# Patient Record
Sex: Female | Born: 1975 | Race: Black or African American | Hispanic: No | Marital: Single | Smoking: Never smoker
Health system: Southern US, Community
[De-identification: ages and names within clinical notes are randomized; demographics above are authoritative.]

## PROBLEM LIST (undated history)

## (undated) DIAGNOSIS — D573 Sickle-cell trait: Secondary | ICD-10-CM

## (undated) DIAGNOSIS — I10 Essential (primary) hypertension: Secondary | ICD-10-CM

## (undated) HISTORY — DX: Sickle-cell trait: D57.3

---

## 2005-06-26 ENCOUNTER — Inpatient Hospital Stay: Payer: Self-pay | Admitting: Obstetrics & Gynecology

## 2014-04-07 DIAGNOSIS — D573 Sickle-cell trait: Secondary | ICD-10-CM | POA: Insufficient documentation

## 2015-01-30 DIAGNOSIS — E669 Obesity, unspecified: Secondary | ICD-10-CM | POA: Insufficient documentation

## 2015-04-12 ENCOUNTER — Emergency Department: Payer: Self-pay

## 2015-04-12 ENCOUNTER — Emergency Department
Admission: EM | Admit: 2015-04-12 | Discharge: 2015-04-12 | Disposition: A | Discharge status: Home / Self Care | Payer: Self-pay | Attending: Emergency Medicine | Admitting: Emergency Medicine

## 2015-04-12 DIAGNOSIS — I10 Essential (primary) hypertension: Secondary | ICD-10-CM | POA: Insufficient documentation

## 2015-04-12 DIAGNOSIS — R42 Dizziness and giddiness: Secondary | ICD-10-CM | POA: Insufficient documentation

## 2015-04-12 LAB — CBC WITH DIFFERENTIAL/PLATELET
BASOS ABS: 0 10*3/uL (ref 0–0.1)
BASOS PCT: 1 %
EOS PCT: 2 %
Eosinophils Absolute: 0.1 10*3/uL (ref 0–0.7)
HCT: 41.7 % (ref 35.0–47.0)
Hemoglobin: 13.8 g/dL (ref 12.0–16.0)
Lymphocytes Relative: 35 %
Lymphs Abs: 1.9 10*3/uL (ref 1.0–3.6)
MCH: 25.9 pg — ABNORMAL LOW (ref 26.0–34.0)
MCHC: 33.2 g/dL (ref 32.0–36.0)
MCV: 78.1 fL — ABNORMAL LOW (ref 80.0–100.0)
MONO ABS: 0.4 10*3/uL (ref 0.2–0.9)
MONOS PCT: 8 %
Neutro Abs: 3 10*3/uL (ref 1.4–6.5)
Neutrophils Relative %: 54 %
PLATELETS: 262 10*3/uL (ref 150–440)
RBC: 5.34 MIL/uL — ABNORMAL HIGH (ref 3.80–5.20)
RDW: 14.1 % (ref 11.5–14.5)
WBC: 5.5 10*3/uL (ref 3.6–11.0)

## 2015-04-12 LAB — COMPREHENSIVE METABOLIC PANEL
ALBUMIN: 4 g/dL (ref 3.5–5.0)
ALT: 13 U/L — ABNORMAL LOW (ref 14–54)
ANION GAP: 9 (ref 5–15)
AST: 20 U/L (ref 15–41)
Alkaline Phosphatase: 76 U/L (ref 38–126)
BUN: 11 mg/dL (ref 6–20)
CHLORIDE: 104 mmol/L (ref 101–111)
CO2: 24 mmol/L (ref 22–32)
Calcium: 9.2 mg/dL (ref 8.9–10.3)
Creatinine, Ser: 0.83 mg/dL (ref 0.44–1.00)
GFR calc Af Amer: >60 mL/min (ref >60)
GLUCOSE: 132 mg/dL — AB (ref 65–99)
POTASSIUM: 3.1 mmol/L — AB (ref 3.5–5.1)
Sodium: 137 mmol/L (ref 135–145)
TOTAL PROTEIN: 7.3 g/dL (ref 6.5–8.1)
Total Bilirubin: 0.7 mg/dL (ref 0.3–1.2)

## 2015-04-12 LAB — TROPONIN I

## 2015-04-12 MED ORDER — GADOBENATE DIMEGLUMINE 529 MG/ML IV SOLN
15.0000 mL | Freq: Once | INTRAVENOUS | Status: AC | PRN
  Administered 2015-04-12: 14 mL via INTRAVENOUS

## 2015-04-12 MED ORDER — SODIUM CHLORIDE 0.9 % IV BOLUS (SEPSIS)
1000.0000 mL | Freq: Once | INTRAVENOUS | Status: AC
  Administered 2015-04-12: 1000 mL via INTRAVENOUS

## 2015-04-12 MED ORDER — MECLIZINE HCL 25 MG PO TABS: 25.0000 mg | ORAL_TABLET | Freq: Three times a day (TID) | ORAL | Status: AC | PRN

## 2015-04-12 MED ORDER — METOCLOPRAMIDE HCL 5 MG/ML IJ SOLN
10.0000 mg | Freq: Once | INTRAMUSCULAR | Status: AC
  Administered 2015-04-12: 10 mg via INTRAVENOUS
  Filled 2015-04-12: qty 2

## 2015-04-12 MED ORDER — DIAZEPAM 5 MG/ML IJ SOLN
5.0000 mg | Freq: Once | INTRAMUSCULAR | Status: AC
  Administered 2015-04-12: 5 mg via INTRAVENOUS
  Filled 2015-04-12: qty 2

## 2015-04-12 MED ORDER — MECLIZINE HCL 25 MG PO TABS
25.0000 mg | ORAL_TABLET | Freq: Once | ORAL | Status: AC
  Administered 2015-04-12: 25 mg via ORAL
  Filled 2015-04-12: qty 1

## 2015-04-12 NOTE — ED Notes (Signed)
Spoke with dr. Huel Cote regarding pt's chief complaint and triage assessment. Order for blood work received, discussed with md need for possible ct scan ofhead, no ct order received.

## 2015-04-12 NOTE — ED Notes (Signed)
Pt states awoke at 0600 to go to restroom and became suddenly dizzy. Pt states 'i feel funny, my head, i just feel spinning". Pt states is more dizzy when she looks to the left. Pt denies pain, had one episode of emesis.

## 2015-04-12 NOTE — ED Notes (Signed)
Patient transported to MRI 

## 2015-04-12 NOTE — ED Notes (Signed)
Report to derrick, rn. Pt to room 16.

## 2015-04-12 NOTE — ED Notes (Signed)
Patient transported to CT 

## 2015-04-12 NOTE — ED Provider Notes (Addendum)
[facility] Emergency Department Provider Note  ____________________________________________  Time seen: Approximately 710 AM  I have reviewed the triage vital signs and the nursing notes.   HISTORY  Chief Complaint Dizziness    HPI Sara Schneider is a 39 y.o. female with a history of hypertension who is presenting with vertigo. The patient awoke at 6 AM and said that the whole room was spinning. She says that the symptoms have improved at this time and there is no vertigo unless she moves her head to the left. At that point the room does begin to spin. Denies any pain. Does not hear any ringing or roaring in her ears. York Spaniel that she has had recently a mild upper respiratory infection with a small amount of nasal congestion.  Denies any pressure in her ears. Never had an event like this in the past.He said that her mother had a stroke when she was in her 30s.   No past medical history on file. hypertension   There are no active problems to display for this patient.   No past surgical history on file.  No current outpatient prescriptions on file.  Allergies Review of patient's allergies indicates no known allergies.  No family history on file.  Social History Social History  Substance Use Topics  . Smoking status: Not on file  . Smokeless tobacco: Not on file  . Alcohol Use: Not on file    Review of Systems Constitutional: No fever/chills Eyes: No visual changes. ENT: No sore throat. Cardiovascular: Denies chest pain. Respiratory: Denies shortness of breath. Gastrointestinal: No abdominal pain.  No nausea, no vomiting.  No diarrhea.  No constipation. Genitourinary: Negative for dysuria. Musculoskeletal: Negative for back pain. Skin: Negative for rash. Neurological: Negative for headaches, focal weakness or numbness.  10-point ROS otherwise negative.  ____________________________________________   PHYSICAL EXAM:  VITAL SIGNS: ED Triage  Vitals  Enc Vitals Group     BP 04/12/15 0650 124/76 mmHg     Pulse Rate 04/12/15 0650 80     Resp 04/12/15 0650 16     Temp 04/12/15 0650 98 F (36.7 C)     Temp Source 04/12/15 0650 Oral     SpO2 04/12/15 0650 100 %     Weight 04/12/15 0650 150 lb (68.04 kg)     Height 04/12/15 0650  (1.575 m)     Head Cir --      Peak Flow --      Pain Score --      Pain Loc --      Pain Edu? --      Excl. in GC? --     Constitutional: Alert and oriented. Well appearing and in no acute distress. Eyes: Conjunctivae are normal. PERRL. EOMI.no nystagmus  Head: Atraumatic. right TM with mild amount of fluid behind it. Neither TM with any bulging or erythema.  Nose: No congestion/rhinnorhea. Mouth/Throat: Mucous membranes are moist.  Oropharynx non-erythematous. Neck: No stridor.   Cardiovascular: Normal rate, regular rhythm. Grossly normal heart sounds.  Good peripheral circulation. Respiratory: Normal respiratory effort.  No retractions. Lungs CTAB. Gastrointestinal: Soft and nontender. No distention. No abdominal bruits. No CVA tenderness. Musculoskeletal: No lower extremity tenderness nor edema.  No joint effusions. Neurologic:  Normal speech and language. No gross focal neurologic deficits are appreciano ataxia on heel to shin movements bilaterally. No ataxia on finger to nose testing bilaterally.  When patient turns her head to the left she experiences a severe vertiginous sensation.  Skin:  Skin is warm, dry and intact. No rash noted. Psychiatric: Mood and affect are normal. Speech and behavior are normal.  ____________________________________________   LABS (all labs ordered are listed, but only abnormal results are displayed)  Labs Reviewed  CBC WITH DIFFERENTIAL/PLATELET - Abnormal; Notable for the following:    RBC 5.34 (*)    MCV 78.1 (*)    MCH 25.9 (*)    All other components within normal limits  TROPONIN I  COMPREHENSIVE METABOLIC PANEL    ____________________________________________  EKG  ED ECG REPORT I, Arelia Longest, the attending physician, personally viewed and interpreted this ECG.   Date: 04/12/2015  EKG Time: 702  Rate: 75  Rhythm: normal sinus rhythm  Axis: Normal axis  Intervals:Mildly prolonged QT interval  ST&T Change: No ST segment elevations or depressions. No abnormal T-wave inversion.  ____________________________________________  RADIOLOGY  Normal CAT scan as well as MRI. ____________________________________________   PROCEDURES   ____________________________________________   INITIAL IMPRESSION / ASSESSENT AND PLAN / ED COURSE  Pertinent labs & imaging results that were available during my care of the patient were reviewed by me and considered in my medical decision making (see chart for details).  Unlikely to be CVA. Patient's symptoms are exquisitely reproduced with motion. Furthermore, the symptoms have improved rapidly which would not make this patient a TPA candidate. We will do a trial of meclizine.  ----------------------------------------- 8:50 AM on 04/12/2015 -----------------------------------------  Patient reassessed and now able to ambulate on her own with a normal gait. However, did vomit in the bathroom when she went to the bathroom prior to my arrival. Says is still feeling dizzy but improved. Attempted a Epley maneuver which induced nystagmus with the patient having very difficult time tolerating and vomited. We will move to IV medications. Will give Reglan, Valium and fluids.  ----------------------------------------- 2:24 PM on 04/12/2015 -----------------------------------------  Patient still with positional vertigo but with multiple normal studies and feeling normal when still. Was able to elicit nystagmus previously on Apley maneuver. We'll be able to discharged home. Will give follow-up with her nose and throat doctor the patient says that she is  feeling better with medication however is not currently back to her baseline. Because she did not return completely to her baseline is the reason why proceeded with advanced radiologic imaging. However, given the multiple negative studies at for confident now that this presentation is most likely peripheral vertigo and can be discharged home. The patient was advised to rest for the next several days and do the Epley maneuver which was given to her on a paper with instructions. She understands the plan and is willing to comply. ____________________________________________   FINAL CLINICAL IMPRESSION(S) / ED DIAGNOSES  Acute vertigo. Initial visit.    Myrna Blazer, MD 04/12/15 1429  Myrna Blazer, MD 04/12/15 [phone]

## 2015-04-12 NOTE — Discharge Instructions (Signed)
Benign Positional Vertigo Vertigo means you feel like you or your surroundings are moving when they are not. Benign positional vertigo is the most common form of vertigo. Benign means that the cause of your condition is not serious. Benign positional vertigo is more common in older adults. CAUSES  Benign positional vertigo is the result of an upset in the labyrinth system. This is an area in the middle ear that helps control your balance. This may be caused by a viral infection, head injury, or repetitive motion. However, often no specific cause is found. SYMPTOMS  Symptoms of benign positional vertigo occur when you move your head or eyes in different directions. Some of the symptoms may include: 1. Loss of balance and falls. 2. Vomiting. 3. Blurred vision. 4. Dizziness. 5. Nausea. 6. Involuntary eye movements (nystagmus). DIAGNOSIS  Benign positional vertigo is usually diagnosed by physical exam. If the specific cause of your benign positional vertigo is unknown, your caregiver may perform imaging tests, such as magnetic resonance imaging (MRI) or computed tomography (CT). TREATMENT  Your caregiver may recommend movements or procedures to correct the benign positional vertigo. Medicines such as meclizine, benzodiazepines, and medicines for nausea may be used to treat your symptoms. In rare cases, if your symptoms are caused by certain conditions that affect the inner ear, you may need surgery. HOME CARE INSTRUCTIONS   Follow your caregiver's instructions.  Move slowly. Do not make sudden body or head movements.  Avoid driving.  Avoid operating heavy machinery.  Avoid performing any tasks that would be dangerous to you or others during a vertigo episode.  Drink enough fluids to keep your urine clear or pale yellow. SEEK IMMEDIATE MEDICAL CARE IF:   You develop problems with walking, weakness, numbness, or using your arms, hands, or legs.  You have difficulty speaking.  You develop  severe headaches.  Your nausea or vomiting continues or gets worse.  You develop visual changes.  Your family or friends notice any behavioral changes.  Your condition gets worse.  You have a fever.  You develop a stiff neck or sensitivity to light. MAKE SURE YOU:   Understand these instructions.  Will watch your condition.  Will get help right away if you are not doing well or get worse. Document Released: 05/03/2006 Document Revised: 10/18/2011 Document Reviewed: 04/15/2011 [facility] Patient Information 2015 [place], [place]. This information is not intended to replace advice given to you by your health care provider. Make sure you discuss any questions you have with your health care provider.  Dizziness  Dizziness means you feel unsteady or lightheaded. You might feel like you are going to pass out (faint). HOME CARE  7. Drink enough fluids to keep your pee (urine) clear or pale yellow. 8. Take your medicines exactly as told by your doctor. If you take blood pressure medicine, always stand up slowly from the lying or sitting position. Hold on to something to steady yourself. 9. If you need to stand in one place for a long time, move your legs often. Tighten and relax your leg muscles. 10. Have someone stay with you until you feel okay. 11. Do not drive or use heavy machinery if you feel dizzy. 12. Do not drink alcohol. GET HELP RIGHT AWAY IF:   You feel dizzy or lightheaded and it gets worse.  You feel sick to your stomach (nauseous), or you throw up (vomit).  You have trouble talking or walking.  You feel weak or have trouble using your arms, hands,  or legs.  You cannot think clearly or have trouble forming sentences.  You have chest pain, belly (abdominal) pain, sweating, or you are short of breath.  Your vision changes.  You are bleeding.  You have problems from your medicine that seem to be getting worse. MAKE SURE YOU:   Understand these  instructions.  Will watch your condition.  Will get help right away if you are not doing well or get worse. Document Released: 07/15/2011 Document Revised: 10/18/2011 Document Reviewed: 07/15/2011 [facility] Patient Information 2015 [place], [place]. This information is not intended to replace advice given to you by your health care provider. Make sure you discuss any questions you have with your health care provider.  Epley Maneuver Self-Care WHAT IS THE EPLEY MANEUVER? The Epley maneuver is an exercise you can do to relieve symptoms of benign paroxysmal positional vertigo (BPPV). This condition is often just referred to as vertigo. BPPV is caused by the movement of tiny crystals (canaliths) inside your inner ear. The accumulation and movement of canaliths in your inner ear causes a sudden spinning sensation (vertigo) when you move your head to certain positions. Vertigo usually lasts about 30 seconds. BPPV usually occurs in just one ear. If you get vertigo when you lie on your left side, you probably have BPPV in your left ear. Your health care provider can tell you which ear is involved.  BPPV may be caused by a head injury. Many people older than 50 get BPPV for unknown reasons. If you have been diagnosed with BPPV, your health care provider may teach you how to do this maneuver. BPPV is not life threatening (benign) and usually goes away in time.  WHEN SHOULD I PERFORM THE EPLEY MANEUVER? You can do this maneuver at home whenever you have symptoms of vertigo. You may do the Epley maneuver up to 3 times a day until your symptoms of vertigo go away. HOW SHOULD I DO THE EPLEY MANEUVER? 13. Sit on the edge of a bed or table with your back straight. Your legs should be extended or hanging over the edge of the bed or table.  14. Turn your head halfway toward the affected ear.  15. Lie backward quickly with your head turned until you are lying flat on your back. You may want to position a pillow under  your shoulders.  16. Hold this position for 30 seconds. You may experience an attack of vertigo. This is normal. Hold this position until the vertigo stops. 17. Then turn your head to the opposite direction until your unaffected ear is facing the floor.  18. Hold this position for 30 seconds. You may experience an attack of vertigo. This is normal. Hold this position until the vertigo stops. 19. Now turn your whole body to the same side as your head. Hold for another 30 seconds.  20. You can then sit back up. ARE THERE RISKS TO THIS MANEUVER? In some cases, you may have other symptoms (such as changes in your vision, weakness, or numbness). If you have these symptoms, stop doing the maneuver and call your health care provider. Even if doing these maneuvers relieves your vertigo, you may still have dizziness. Dizziness is the sensation of light-headedness but without the sensation of movement. Even though the Epley maneuver may relieve your vertigo, it is possible that your symptoms will return within 5 years. WHAT SHOULD I DO AFTER THIS MANEUVER? After doing the Epley maneuver, you can return to your normal activities. Ask your doctor  if there is anything you should do at home to prevent vertigo. This may include:  Sleeping with two or more pillows to keep your head elevated.  Not sleeping on the side of your affected ear.  Getting up slowly from bed.  Avoiding sudden movements during the day.  Avoiding extreme head movement, like looking up or bending over.  Wearing a cervical collar to prevent sudden head movements. WHAT SHOULD I DO IF MY SYMPTOMS GET WORSE? Call your health care provider if your vertigo gets worse. Call your provider right way if you have other symptoms, including:   Nausea.  Vomiting.  Headache.  Weakness.  Numbness.  Vision changes. Document Released: 07/31/2013 Document Reviewed: 07/31/2013 [facility] Patient Information 2015 [place], [place]. This  information is not intended to replace advice given to you by your health care provider. Make sure you discuss any questions you have with your health care provider.

## 2015-04-12 NOTE — ED Notes (Signed)
pT RETURNED FROM ct

## 2015-04-22 DIAGNOSIS — Z8742 Personal history of other diseases of the female genital tract: Secondary | ICD-10-CM | POA: Insufficient documentation

## 2016-10-18 ENCOUNTER — Emergency Department
Admission: EM | Admit: 2016-10-18 | Discharge: 2016-10-18 | Disposition: A | Discharge status: Home / Self Care | Payer: BLUE CROSS/BLUE SHIELD | Attending: Emergency Medicine | Admitting: Emergency Medicine

## 2016-10-18 ENCOUNTER — Encounter: Payer: Self-pay | Admitting: Emergency Medicine

## 2016-10-18 DIAGNOSIS — Z79899 Other long term (current) drug therapy: Secondary | ICD-10-CM | POA: Diagnosis not present

## 2016-10-18 DIAGNOSIS — I10 Essential (primary) hypertension: Secondary | ICD-10-CM | POA: Insufficient documentation

## 2016-10-18 DIAGNOSIS — R42 Dizziness and giddiness: Secondary | ICD-10-CM | POA: Diagnosis not present

## 2016-10-18 HISTORY — DX: Essential (primary) hypertension: I10

## 2016-10-18 LAB — BASIC METABOLIC PANEL
ANION GAP: 8 (ref 5–15)
BUN: 9 mg/dL (ref 6–20)
CHLORIDE: 102 mmol/L (ref 101–111)
CO2: 26 mmol/L (ref 22–32)
Calcium: 9.3 mg/dL (ref 8.9–10.3)
Creatinine, Ser: 0.58 mg/dL (ref 0.44–1.00)
GFR calc Af Amer: >60 mL/min (ref >60)
GFR calc non Af Amer: >60 mL/min (ref >60)
Glucose, Bld: 100 mg/dL — ABNORMAL HIGH (ref 65–99)
Potassium: 3.7 mmol/L (ref 3.5–5.1)
Sodium: 136 mmol/L (ref 135–145)

## 2016-10-18 LAB — CBC
HCT: 42.5 % (ref 35.0–47.0)
Hemoglobin: 14.2 g/dL (ref 12.0–16.0)
MCH: 25.9 pg — AB (ref 26.0–34.0)
MCHC: 33.5 g/dL (ref 32.0–36.0)
MCV: 77.1 fL — AB (ref 80.0–100.0)
Platelets: 291 10*3/uL (ref 150–440)
RBC: 5.51 MIL/uL — ABNORMAL HIGH (ref 3.80–5.20)
RDW: 13.7 % (ref 11.5–14.5)
WBC: 6.2 10*3/uL (ref 3.6–11.0)

## 2016-10-18 LAB — URINALYSIS, COMPLETE (UACMP) WITH MICROSCOPIC
BACTERIA UA: NONE SEEN
Bilirubin Urine: NEGATIVE
Glucose, UA: NEGATIVE mg/dL
Hgb urine dipstick: NEGATIVE
Ketones, ur: NEGATIVE mg/dL
Leukocytes, UA: NEGATIVE
Nitrite: NEGATIVE
PROTEIN: NEGATIVE mg/dL
SQUAMOUS EPITHELIAL / LPF: NONE SEEN
Specific Gravity, Urine: 1.005 (ref 1.005–1.030)
pH: 8 (ref 5.0–8.0)

## 2016-10-18 MED ORDER — PSEUDOEPHEDRINE HCL ER 120 MG PO TB12
120.0000 mg | ORAL_TABLET | Freq: Two times a day (BID) | ORAL | Status: DC
  Administered 2016-10-18: 120 mg via ORAL
  Filled 2016-10-18 (×2): qty 1

## 2016-10-18 MED ORDER — MECLIZINE HCL 32 MG PO TABS: 32.0000 mg | ORAL_TABLET | Freq: Three times a day (TID) | ORAL | 0 refills | Status: AC | PRN

## 2016-10-18 MED ORDER — PSEUDOEPHEDRINE HCL ER 120 MG PO TB12: 120.0000 mg | ORAL_TABLET | Freq: Two times a day (BID) | ORAL | 2 refills | Status: AC | PRN

## 2016-10-18 MED ORDER — MECLIZINE HCL 25 MG PO TABS
50.0000 mg | ORAL_TABLET | Freq: Once | ORAL | Status: AC
Start: 2016-10-18 — End: 2016-10-18
  Administered 2016-10-18: 50 mg via ORAL
  Filled 2016-10-18: qty 2

## 2016-10-18 NOTE — ED Notes (Signed)
First nurse note  Presents with dizziness  States she was dx'd with vertigo about 1 week ago  Sx's are worse

## 2016-10-18 NOTE — ED Triage Notes (Signed)
Pt with vertigo that started Feb 28. Went to urgent care 3/5 and was given meclizine x 2 days and OTC allergy meds. The meclizine helped some but pt states she is still very dizzy and unsteady on her feet.

## 2016-10-18 NOTE — ED Provider Notes (Signed)
[facility] Emergency Department Provider Note   ____________________________________________   First MD Initiated Contact with Patient 10/18/16 1313     (approximate)  I have reviewed the triage vital signs and the nursing notes.   HISTORY  Chief Complaint Dizziness    HPI Sara Schneider is a 41 y.o. female  patient complaining of vertigo for 2 weeks. Patient stated onset was the 28th for a 2018. Patient states she went to urgent care on 10/09/2016 and was given Antivert and Sudafed. Stated medication helped but she is out of the medication and is now having vertigo again. Patient denies nausea or vision disturbance. Patient state has similar episode one half years ago which last approximately 3 weeks. Patient stated at that time she had CT scan and MRI which were negative. Patient was referred to Vestibular rehabilitation on 10/13/2016. Patient's appointment is 10/22/2016. Past Medical History:  Diagnosis Date  . Hypertension     There are no active problems to display for this patient.   No past surgical history on file.  Prior to Admission medications   Medication Sig Start Date End Date Taking? Authorizing Provider  lisinopril-hydrochlorothiazide (PRINZIDE,ZESTORETIC) 20-25 MG per tablet Take 1 tablet by mouth daily.    Historical Provider, MD  meclizine (ANTIVERT) 25 MG tablet Take 1 tablet (25 mg total) by mouth 3 (three) times daily as needed for dizziness. 04/12/15   Myrna Blazer, MD  meclizine (ANTIVERT) 32 MG tablet Take 1 tablet (32 mg total) by mouth 3 (three) times daily as needed. 10/18/16   Joni Reining, PA-C  medroxyPROGESTERone (DEPO-PROVERA) 150 MG/ML injection Inject 150 mg into the muscle every 3 (three) months.    Historical Provider, MD  pseudoephedrine (SUDAFED) 120 MG 12 hr tablet Take 1 tablet (120 mg total) by mouth 2 (two) times daily as needed for congestion. 10/18/16 10/18/17  Joni Reining, PA-C     Allergies Patient has no known allergies.  No family history on file.  Social History Social History  Substance Use Topics  . Smoking status: Never Smoker  . Smokeless tobacco: Never Used  . Alcohol use No    Review of Systems Constitutional: No fever/chills Eyes: No visual changes. ENT: No sore throat. Cardiovascular: Denies chest pain. Respiratory: Denies shortness of breath. Gastrointestinal: No abdominal pain.  No nausea, no vomiting.  No diarrhea.  No constipation. Genitourinary: Negative for dysuria. Musculoskeletal: Negative for back pain. Skin: Negative for rash. Neurological: Negative for headaches, focal weakness or numbness. Vertigo   ____________________________________________   PHYSICAL EXAM:  VITAL SIGNS: ED Triage Vitals  Enc Vitals Group     BP 10/18/16 1058 128/82     Pulse Rate 10/18/16 1058 (!) 108     Resp 10/18/16 1058 18     Temp 10/18/16 1058 98.9 F (37.2 C)     Temp Source 10/18/16 1058 Oral     SpO2 10/18/16 1058 99 %     Weight 10/18/16 1059 159 lb (72.1 kg)     Height 10/18/16 1059 5' (1.524 m)     Head Circumference --      Peak Flow --      Pain Score --      Pain Loc --      Pain Edu? --      Excl. in GC? --     Constitutional: Alert and oriented. Well appearing and in no acute distress. Eyes: Conjunctivae are normal. PERRL. EOMI. Head: Atraumatic. Nose: No congestion/rhinnorhea. Mouth/Throat:  Mucous membranes are moist.  Oropharynx non-erythematous. Neck: No stridor.  No cervical spine tenderness to palpation. Hematological/Lymphatic/Immunilogical: No cervical lymphadenopathy. Cardiovascular:Tachycardic at 103 BPM. Grossly normal heart sounds.  Good peripheral circulation. Respiratory: Normal respiratory effort.  No retractions. Lungs CTAB. Gastrointestinal: Soft and nontender. No distention. No abdominal bruits. No CVA tenderness. Musculoskeletal: No lower extremity tenderness nor edema.  No joint  effusions. Neurologic:  Normal speech and language. No gross focal neurologic deficits are appreciated. No gait instability. Negative for orthostatic findings. Skin:  Skin is warm, dry and intact. No rash noted. Psychiatric: Mood and affect are normal. Speech and behavior are normal.  ____________________________________________   LABS (all labs ordered are listed, but only abnormal results are displayed)  Labs Reviewed  BASIC METABOLIC PANEL - Abnormal; Notable for the following:       Result Value   Glucose, Bld 100 (*)    All other components within normal limits  CBC - Abnormal; Notable for the following:    RBC 5.51 (*)    MCV 77.1 (*)    MCH 25.9 (*)    All other components within normal limits  URINALYSIS, COMPLETE (UACMP) WITH MICROSCOPIC - Abnormal; Notable for the following:    Color, Urine STRAW (*)    APPearance CLEAR (*)    All other components within normal limits   ____________________________________________  EKG   ____________________________________________  RADIOLOGY   ____________________________________________   PROCEDURES  Procedure(s) performed: None  Procedures  Critical Care performed: No  ____________________________________________   INITIAL IMPRESSION / ASSESSMENT AND PLAN / ED COURSE  Pertinent labs & imaging results that were available during my care of the patient were reviewed by me and considered in my medical decision making (see chart for details).  Vertigo. Patient was tilt negative. Discussed sequela critical patient. Patient advised follow-up with scheduled appointment. Patient given a prescription for Antivert and Sudafed. Patient given a work note.      ____________________________________________   FINAL CLINICAL IMPRESSION(S) / ED DIAGNOSES  Final diagnoses:  Vertigo      NEW MEDICATIONS STARTED DURING THIS VISIT:  New Prescriptions   MECLIZINE (ANTIVERT) 32 MG TABLET    Take 1 tablet (32 mg total) by  mouth 3 (three) times daily as needed.   PSEUDOEPHEDRINE (SUDAFED) 120 MG 12 HR TABLET    Take 1 tablet (120 mg total) by mouth 2 (two) times daily as needed for congestion.     Note:  This document was prepared using Dragon voice recognition software and may include unintentional dictation errors.    Joni Reiningonald K Decker Cogdell, PA-C 10/18/16 1355    Merrily BrittleNeil Rifenbark, MD 10/18/16 [phone]

## 2016-10-18 NOTE — Discharge Instructions (Signed)
Restart  medications as directed and follow-up with scheduled Vestibular Rehab.

## 2016-10-22 ENCOUNTER — Ambulatory Visit: Payer: BLUE CROSS/BLUE SHIELD | Admitting: Physical Therapy

## 2016-10-26 DIAGNOSIS — H8113 Benign paroxysmal vertigo, bilateral: Secondary | ICD-10-CM | POA: Insufficient documentation

## 2016-11-02 ENCOUNTER — Ambulatory Visit: Payer: BLUE CROSS/BLUE SHIELD | Admitting: Physical Therapy

## 2016-11-12 ENCOUNTER — Encounter: Payer: BLUE CROSS/BLUE SHIELD | Admitting: Physical Therapy

## 2016-11-12 ENCOUNTER — Encounter: Payer: Self-pay | Admitting: Physical Therapy

## 2016-11-19 ENCOUNTER — Encounter: Payer: Self-pay | Admitting: Physical Therapy

## 2016-11-26 ENCOUNTER — Encounter: Payer: Self-pay | Admitting: Physical Therapy

## 2016-12-03 ENCOUNTER — Encounter: Payer: Self-pay | Admitting: Physical Therapy

## 2016-12-09 ENCOUNTER — Encounter: Payer: Self-pay | Admitting: Physical Therapy

## 2017-06-29 LAB — HM PAP SMEAR: HM PAP: NEGATIVE

## 2018-09-15 DIAGNOSIS — Z8742 Personal history of other diseases of the female genital tract: Secondary | ICD-10-CM

## 2018-09-15 DIAGNOSIS — Z6831 Body mass index (BMI) 31.0-31.9, adult: Secondary | ICD-10-CM

## 2018-09-15 DIAGNOSIS — E669 Obesity, unspecified: Secondary | ICD-10-CM

## 2018-09-15 DIAGNOSIS — D573 Sickle-cell trait: Secondary | ICD-10-CM

## 2019-04-04 ENCOUNTER — Other Ambulatory Visit: Payer: Self-pay

## 2019-04-04 ENCOUNTER — Ambulatory Visit (LOCAL_COMMUNITY_HEALTH_CENTER): Payer: BC Managed Care – PPO

## 2019-04-04 VITALS — BP 111/80 | Ht 61.0 in | Wt 167.5 lb

## 2019-04-04 DIAGNOSIS — Z30013 Encounter for initial prescription of injectable contraceptive: Secondary | ICD-10-CM | POA: Diagnosis not present

## 2019-04-04 DIAGNOSIS — Z3009 Encounter for other general counseling and advice on contraception: Secondary | ICD-10-CM | POA: Diagnosis not present

## 2019-04-04 MED ORDER — MULTI-VITAMIN/MINERALS PO TABS: 1.0000 | ORAL_TABLET | Freq: Every day | ORAL | 0 refills | Status: DC

## 2019-04-04 MED ORDER — MEDROXYPROGESTERONE ACETATE 150 MG/ML IM SUSP
150.0000 mg | Freq: Once | INTRAMUSCULAR | Status: AC
  Administered 2019-04-04: 150 mg via INTRAMUSCULAR

## 2019-04-04 NOTE — Progress Notes (Signed)
Folic acid counseling completed and MVI accepted. Depo administered per 08/04/2018 written order of Antoine Primas PA and client tolerated without complaint. Client requested in gluteus today. Per client, if received in arm, prefers to stand for injection. Client declined Depo reminder card as plans to put next Depo date of 06/22/2019 in phone. Rich Number, RN

## 2019-06-27 ENCOUNTER — Ambulatory Visit: Payer: BC Managed Care – PPO

## 2019-06-27 ENCOUNTER — Other Ambulatory Visit: Payer: Self-pay

## 2019-06-27 ENCOUNTER — Ambulatory Visit (LOCAL_COMMUNITY_HEALTH_CENTER): Payer: BC Managed Care – PPO

## 2019-06-27 VITALS — Ht 61.0 in | Wt 162.5 lb

## 2019-06-27 DIAGNOSIS — Z3009 Encounter for other general counseling and advice on contraception: Secondary | ICD-10-CM

## 2019-06-27 MED ORDER — MEDROXYPROGESTERONE ACETATE 150 MG/ML IM SUSP
150.0000 mg | Freq: Once | INTRAMUSCULAR | Status: AC
  Administered 2019-06-27: 11:00:00 150 mg via INTRAMUSCULAR

## 2019-06-27 MED ORDER — MULTI-VITAMIN/MINERALS PO TABS: 1.0000 | ORAL_TABLET | Freq: Every day | ORAL | 0 refills | Status: AC

## 2019-06-27 NOTE — Progress Notes (Signed)
Depo administered per 08/04/2018 written order of Antoine Primas PA and client tolerated without complaint. Client counseled to schedule physical appt when next Depo due and if agency not yet scheduling physicals, to request appt with provider to discuss BCM. Rich Number, RN

## 2019-09-11 ENCOUNTER — Other Ambulatory Visit: Payer: Self-pay | Admitting: Internal Medicine

## 2019-09-11 DIAGNOSIS — Z1231 Encounter for screening mammogram for malignant neoplasm of breast: Secondary | ICD-10-CM

## 2019-09-13 ENCOUNTER — Ambulatory Visit (LOCAL_COMMUNITY_HEALTH_CENTER): Payer: BC Managed Care – PPO | Admitting: Physician Assistant

## 2019-09-13 ENCOUNTER — Encounter: Payer: Self-pay | Admitting: Physician Assistant

## 2019-09-13 ENCOUNTER — Other Ambulatory Visit: Payer: Self-pay

## 2019-09-13 VITALS — BP 114/83 | Ht 60.5 in | Wt 163.8 lb

## 2019-09-13 DIAGNOSIS — Z3009 Encounter for other general counseling and advice on contraception: Secondary | ICD-10-CM

## 2019-09-13 DIAGNOSIS — Z3042 Encounter for surveillance of injectable contraceptive: Secondary | ICD-10-CM | POA: Diagnosis not present

## 2019-09-13 MED ORDER — MEDROXYPROGESTERONE ACETATE 150 MG/ML IM SUSP
150.0000 mg | Freq: Once | INTRAMUSCULAR | Status: AC
  Administered 2019-09-13: 12:00:00 150 mg via INTRAMUSCULAR

## 2019-09-13 MED ORDER — MULTIVITAMINS PO CAPS: 1.0000 | ORAL_CAPSULE | Freq: Every day | ORAL | 0 refills | Status: AC

## 2019-09-13 NOTE — Progress Notes (Addendum)
Provider verbal order given to administer Depo 150 mg IM Q 11-13 weeks x 1 year. Depo administered per above order and tolerated well. Accepted MVI's and condoms. Reminder card given to return around 11/29/19 for next Depo and physical if agency has resumed physicals at that time. Tawny Hopping, RN

## 2019-09-13 NOTE — Progress Notes (Signed)
Here today for Depo. Last PE was 08/04/2018 and last Pap Smear (NIL) was 06/29/2017. Last depo was 06/27/2019 (11.1 weeks.) Tawny Hopping, RN

## 2019-10-15 ENCOUNTER — Ambulatory Visit
Admission: RE | Admit: 2019-10-15 | Discharge: 2019-10-15 | Disposition: A | Discharge status: Home / Self Care | Payer: BC Managed Care – PPO | Source: Ambulatory Visit | Attending: Internal Medicine | Admitting: Internal Medicine

## 2019-10-15 DIAGNOSIS — Z1231 Encounter for screening mammogram for malignant neoplasm of breast: Secondary | ICD-10-CM | POA: Diagnosis not present

## 2019-11-12 ENCOUNTER — Ambulatory Visit: Payer: BC Managed Care – PPO | Attending: Internal Medicine

## 2019-11-12 DIAGNOSIS — Z23 Encounter for immunization: Secondary | ICD-10-CM

## 2019-11-12 NOTE — Progress Notes (Signed)
   Covid-19 Vaccination Clinic  Name:  STATIA BURDICK    MRN: [id number] DOB: 1976/06/30  11/12/2019  Ms. Bracher was observed post Covid-19 immunization for 15 minutes without incident. She was provided with Vaccine Information Sheet and instruction to access the V-Safe system.   Ms. Ventress was instructed to call 911 with any severe reactions post vaccine: Marland Kitchen Difficulty breathing  . Swelling of face and throat  . A fast heartbeat  . A bad rash all over body  . Dizziness and weakness   Immunizations Administered    Name Date Dose VIS Date Route   Pfizer COVID-19 Vaccine 11/12/2019  3:09 PM 0.3 mL 07/20/2019 Intramuscular   Manufacturer: [employer], [employer]   Lot: [id number]   NDC: [address]-1

## 2019-12-05 ENCOUNTER — Ambulatory Visit: Payer: BC Managed Care – PPO | Attending: Internal Medicine

## 2019-12-05 DIAGNOSIS — Z23 Encounter for immunization: Secondary | ICD-10-CM

## 2019-12-05 NOTE — Progress Notes (Signed)
   Covid-19 Vaccination Clinic  Name:  CAMIKA MARSICO    MRN: [id number] DOB: [birth year]  12/05/2019  Ms. Rossitto was observed post Covid-19 immunization for 15 minutes without incident. She was provided with Vaccine Information Sheet and instruction to access the V-Safe system.   Ms. Downs was instructed to call 911 with any severe reactions post vaccine: Marland Kitchen Difficulty breathing  . Swelling of face and throat  . A fast heartbeat  . A bad rash all over body  . Dizziness and weakness   Immunizations Administered    Name Date Dose VIS Date Route   Pfizer COVID-19 Vaccine 12/05/2019  3:49 PM 0.3 mL 10/03/2018 Intramuscular   Manufacturer: [employer], [employer]   Lot: [id number]   NDC: [address]-2

## 2019-12-14 ENCOUNTER — Ambulatory Visit (LOCAL_COMMUNITY_HEALTH_CENTER): Payer: BC Managed Care – PPO

## 2019-12-14 ENCOUNTER — Other Ambulatory Visit: Payer: Self-pay

## 2019-12-14 VITALS — BP 113/82 | Ht 60.5 in | Wt 157.5 lb

## 2019-12-14 DIAGNOSIS — Z30013 Encounter for initial prescription of injectable contraceptive: Secondary | ICD-10-CM

## 2019-12-14 DIAGNOSIS — Z3042 Encounter for surveillance of injectable contraceptive: Secondary | ICD-10-CM

## 2019-12-14 DIAGNOSIS — Z3009 Encounter for other general counseling and advice on contraception: Secondary | ICD-10-CM

## 2019-12-14 MED ORDER — MEDROXYPROGESTERONE ACETATE 150 MG/ML IM SUSP
150.0000 mg | Freq: Once | INTRAMUSCULAR | Status: AC
  Administered 2019-12-14: 150 mg via INTRAMUSCULAR

## 2019-12-14 MED ORDER — MULTIVITAMINS PO CAPS: 1.0000 | ORAL_CAPSULE | Freq: Every day | ORAL | 0 refills | Status: AC

## 2019-12-14 NOTE — Progress Notes (Signed)
Pt is 13.1 weeks post depo today.  DMPA 150 mg IM administered today per Alexia Freestone Streilein's order dated 09/13/19.

## 2019-12-28 ENCOUNTER — Telehealth: Payer: Self-pay | Admitting: *Deleted

## 2019-12-28 NOTE — Telephone Encounter (Signed)
FMLA paperwork complete 

## 2020-03-10 ENCOUNTER — Ambulatory Visit: Payer: BC Managed Care – PPO

## 2020-03-10 ENCOUNTER — Encounter: Payer: Self-pay | Admitting: Physician Assistant

## 2020-03-10 ENCOUNTER — Other Ambulatory Visit: Payer: Self-pay

## 2020-03-10 ENCOUNTER — Ambulatory Visit (LOCAL_COMMUNITY_HEALTH_CENTER): Payer: BC Managed Care – PPO | Admitting: Physician Assistant

## 2020-03-10 VITALS — BP 146/101 | Ht 61.0 in | Wt 162.6 lb

## 2020-03-10 DIAGNOSIS — Z3042 Encounter for surveillance of injectable contraceptive: Secondary | ICD-10-CM

## 2020-03-10 DIAGNOSIS — Z3009 Encounter for other general counseling and advice on contraception: Secondary | ICD-10-CM | POA: Diagnosis not present

## 2020-03-10 DIAGNOSIS — Z30013 Encounter for initial prescription of injectable contraceptive: Secondary | ICD-10-CM

## 2020-03-10 DIAGNOSIS — Z01419 Encounter for gynecological examination (general) (routine) without abnormal findings: Secondary | ICD-10-CM

## 2020-03-10 LAB — WET PREP FOR TRICH, YEAST, CLUE
Trichomonas Exam: NEGATIVE
Yeast Exam: NEGATIVE

## 2020-03-10 MED ORDER — THERA VITAL M PO TABS: 1.0000 | ORAL_TABLET | Freq: Every day | ORAL | 0 refills | Status: AC

## 2020-03-10 MED ORDER — MEDROXYPROGESTERONE ACETATE 150 MG/ML IM SUSP
150.0000 mg | INTRAMUSCULAR | Status: AC
  Administered 2020-03-10 – 2020-10-31 (×4): 150 mg via INTRAMUSCULAR

## 2020-03-10 NOTE — Progress Notes (Signed)
Repeat BP 144/95 and Sadie Haber, PA made aware. Pt reports she didn't take her BP medicine this morning. Wet mount reviewed and is negative today, so no treatment needed for wet mount per standing order. Pt received Depo 150mg  IM today per provider order and pt tolerated well. Pt received MVI's per pt request and per standing order. Reminder card of when next Depo will be due given to pt. Counseled pt per provider orders and pt states understanding. Provider orders completed.

## 2020-03-10 NOTE — Progress Notes (Signed)
Pt reports she is here for physical and Depo. Pt reports she [facility] Pt's last physical at [facility] was 08/04/2018. Pt's last Depo was 12/14/2019, so pt is 12 weeks and 3 days post last Depo. Pt's BP 146/101, pt denies any headaches or chest pain today.

## 2020-03-10 NOTE — Progress Notes (Signed)
Family Planning Visit- Repeat Yearly Visit  Subjective:  Sara Schneider is a 44 y.o. [phone]  being seen today for an well woman visit and to discuss family planning options.    She is currently using Depo Provera for pregnancy prevention. Patient reports she does not  want a pregnancy in the next year. Patient  has History of abnormal cervical Pap smear; Obesity, unspecified; Sickle-cell trait (HCC); and Benign paroxysmal positional vertigo due to bilateral vestibular disorder on their problem list.  Chief Complaint  Patient presents with  . Contraception    Physical and Depo    Patient reports that she is doing well with the Depo as her [facility] and desires to continue.  States that she did not take her BP medicine yet this morning and that she is nervous because she is to get a pap today.    Patient denies changes to her personal and family history and any other concerns today.    See flowsheet for other program required questions.   Body mass index is 30.72 kg/m. - Patient is eligible for diabetes screening based on BMI and age >44?  yes HA1C ordered? No.  Per patient she is followed by PCP for this blood work.   Patient reports 1 of partners in last year. Desires STI screening?  No - patient declines.   Has patient been screened once for HCV in the past?  No  No results found for: HCVAB  Does the patient have current of drug use, have a partner with drug use, and/or has been incarcerated since last result? No  If yes-- Screen for HCV through [facility]   Does the patient meet criteria for HBV testing? No  Criteria:  -Household, sexual or needle sharing contact with HBV -History of drug use -HIV positive -Those with known Hep C   Health Maintenance Due  Topic Date Due  . Hepatitis C Screening  Never done  . HIV Screening  Never done  . INFLUENZA VACCINE  03/09/2020    Review of Systems  All other systems reviewed and are negative.   The following portions of the  patient's history were reviewed and updated as appropriate: allergies, current medications, past family history, past medical history, past social history, past surgical history and problem list. Problem list updated.  Objective:   Vitals:   03/10/20 0931  BP: (!) 146/101  Weight: 162 lb 9.6 oz (73.8 kg)  Height: 5\' 1"  (1.549 m)    Physical Exam Vitals and nursing note reviewed.  Constitutional:      General: She is not in acute distress.    Appearance: Normal appearance.  HENT:     Head: Normocephalic and atraumatic.  Eyes:     Conjunctiva/sclera: Conjunctivae normal.  Neck:     Thyroid: No thyroid mass, thyromegaly or thyroid tenderness.  Cardiovascular:     Rate and Rhythm: Normal rate and regular rhythm.  Pulmonary:     Effort: Pulmonary effort is normal.     Breath sounds: Normal breath sounds.  Chest:     Breasts:        Right: Normal. No mass, nipple discharge, skin change or tenderness.        Left: Normal. No mass, nipple discharge, skin change or tenderness.  Abdominal:     Palpations: Abdomen is soft. There is no mass.     Tenderness: There is no abdominal tenderness. There is no guarding or rebound.  Genitourinary:    General: Normal vulva.  Rectum: Normal.     Comments: External genitalia/pubic area without nits, lice, edema, erythema, lesions and inguinal adenopathy. Vagina with normal mucosa and discharge. Cervix without visible lesions, small amount of bleeding after cytobrush. Uterus firm, mobile, nt, no masses, no CMT, no adnexal tenderness or fullness. Musculoskeletal:     Cervical back: Neck supple. No tenderness.  Lymphadenopathy:     Cervical: No cervical adenopathy.     Upper Body:     Right upper body: No supraclavicular, axillary or pectoral adenopathy.     Left upper body: No supraclavicular, axillary or pectoral adenopathy.  Skin:    General: Skin is warm and dry.     Findings: No bruising, erythema, lesion or rash.  Neurological:      Mental Status: She is alert and oriented to person, place, and time.  Psychiatric:        Mood and Affect: Mood normal.        Behavior: Behavior normal.        Thought Content: Thought content normal.        Judgment: Judgment normal.       Assessment and Plan:  Sara Schneider is a 44 y.o. female [phone] presenting to the [facility] Department for an yearly well woman exam/family planning visit  Contraception counseling: Reviewed all forms of birth control options in the tiered based approach. available including abstinence; over the counter/barrier methods; hormonal contraceptive medication including pill, patch, ring, injection,contraceptive implant, ECP; hormonal and nonhormonal IUDs; permanent sterilization options including vasectomy and the various tubal sterilization modalities. Risks, benefits, and typical effectiveness rates were reviewed.  Questions were answered.  Written information was also given to the patient to review.  Patient desires to continue with Depo, this was prescribed for patient. She will follow up in  3 months and prn for surveillance.  She was told to call with any further questions, or with any concerns about this method of contraception.  Emphasized use of condoms 100% of the time for STI prevention.  Patient was not a candidate for ECP.  1. Encounter for counseling regarding contraception Reviewed with patient SE of Depo and when to call clinic with irregular bleeding. Enc condoms with all sex. Enc MVI 1 po daily. - Multiple Vitamins-Minerals (MULTIVITAMIN) tablet; Take 1 tablet by mouth daily.  Dispense: 100 tablet; Refill: 0  2. Well female exam with routine gynecological exam Reviewed healthy habits with patient to maintain general health. Enc to follow up with PCP per recs, for illness and age appropriate screenings. Enc to patient BP medicines regularly. Await test results.  Counseled that RN will send a letter or call once pap results are  back.  - WET PREP FOR TRICH, YEAST, CLUE - IGP, Aptima HPV  3. Surveillance for Depo-Provera contraception Continue Depo 150 mg IM q 11-13 weeks for 1 year. - medroxyPROGESTERone (DEPO-PROVERA) injection 150 mg     Return in about 11 weeks (around 05/26/2020) for for Depo, annual and PRN.  No future appointments.  Matt Holmes, PA

## 2020-03-12 LAB — IGP, APTIMA HPV
HPV Aptima: POSITIVE — AB
PAP Smear Comment: 0

## 2020-03-14 ENCOUNTER — Telehealth: Payer: Self-pay

## 2020-03-14 NOTE — Telephone Encounter (Signed)
Telephone call to patient today regarding her PAP results and the need for a Colpo referral.  She does have [facility] and desires her Colpo at [facility].  She has had a procedure thru [facility] before.    Colpo referral made today at [facility].  Records faxed today. Hart Carwin, RN

## 2020-05-02 ENCOUNTER — Encounter: Payer: Self-pay | Admitting: Physician Assistant

## 2020-05-02 NOTE — Progress Notes (Signed)
Reviewed note by MD who preformed colpo and agree with note that patient needs repeat cotest in 1 year.

## 2020-05-27 ENCOUNTER — Ambulatory Visit (LOCAL_COMMUNITY_HEALTH_CENTER): Payer: BC Managed Care – PPO

## 2020-05-27 ENCOUNTER — Other Ambulatory Visit: Payer: Self-pay

## 2020-05-27 VITALS — BP 128/90 | Ht 61.0 in | Wt 168.0 lb

## 2020-05-27 DIAGNOSIS — Z30013 Encounter for initial prescription of injectable contraceptive: Secondary | ICD-10-CM | POA: Diagnosis not present

## 2020-05-27 DIAGNOSIS — Z3009 Encounter for other general counseling and advice on contraception: Secondary | ICD-10-CM

## 2020-05-27 DIAGNOSIS — Z3042 Encounter for surveillance of injectable contraceptive: Secondary | ICD-10-CM

## 2020-05-27 NOTE — Progress Notes (Signed)
Pt is 11.1 weeks post depo today. DMPA 150 mg IM administered today per Sadie Haber, PA order dated 03/10/20.

## 2020-08-13 ENCOUNTER — Ambulatory Visit (LOCAL_COMMUNITY_HEALTH_CENTER): Payer: BC Managed Care – PPO

## 2020-08-13 ENCOUNTER — Other Ambulatory Visit: Payer: Self-pay

## 2020-08-13 VITALS — BP 120/82 | Ht 61.0 in | Wt 163.0 lb

## 2020-08-13 DIAGNOSIS — Z3042 Encounter for surveillance of injectable contraceptive: Secondary | ICD-10-CM | POA: Diagnosis not present

## 2020-08-13 DIAGNOSIS — Z3009 Encounter for other general counseling and advice on contraception: Secondary | ICD-10-CM

## 2020-08-13 NOTE — Progress Notes (Unsigned)
11 weeks 1 day post depo. Voices no complaints. Depo given Left Deltoid per order C. [place], [place] dated 03/10/2020. Tolerated well. Next depo due 10/29/2020, pt aware. Jerel Shepherd, RN

## 2020-10-31 ENCOUNTER — Ambulatory Visit (LOCAL_COMMUNITY_HEALTH_CENTER): Payer: BC Managed Care – PPO

## 2020-10-31 ENCOUNTER — Other Ambulatory Visit: Payer: Self-pay

## 2020-10-31 VITALS — BP 119/86 | Wt 162.0 lb

## 2020-10-31 DIAGNOSIS — Z3009 Encounter for other general counseling and advice on contraception: Secondary | ICD-10-CM | POA: Diagnosis not present

## 2020-10-31 DIAGNOSIS — Z3042 Encounter for surveillance of injectable contraceptive: Secondary | ICD-10-CM

## 2020-10-31 NOTE — Progress Notes (Signed)
Depo given right glut per C. [place] PA 03/10/20 order; tolerated well Richmond Campbell, RN

## 2020-11-25 ENCOUNTER — Other Ambulatory Visit: Payer: Self-pay | Admitting: Internal Medicine

## 2020-11-25 DIAGNOSIS — Z1231 Encounter for screening mammogram for malignant neoplasm of breast: Secondary | ICD-10-CM

## 2020-12-08 ENCOUNTER — Other Ambulatory Visit: Payer: Self-pay

## 2020-12-08 ENCOUNTER — Ambulatory Visit
Admission: RE | Admit: 2020-12-08 | Discharge: 2020-12-08 | Disposition: A | Discharge status: Home / Self Care | Payer: BC Managed Care – PPO | Source: Ambulatory Visit | Attending: Internal Medicine | Admitting: Internal Medicine

## 2020-12-08 DIAGNOSIS — Z1231 Encounter for screening mammogram for malignant neoplasm of breast: Secondary | ICD-10-CM | POA: Diagnosis present

## 2021-01-20 ENCOUNTER — Ambulatory Visit (LOCAL_COMMUNITY_HEALTH_CENTER): Payer: BC Managed Care – PPO

## 2021-01-20 ENCOUNTER — Other Ambulatory Visit: Payer: Self-pay

## 2021-01-20 VITALS — BP 126/89 | Ht 61.0 in | Wt 166.5 lb

## 2021-01-20 DIAGNOSIS — Z3042 Encounter for surveillance of injectable contraceptive: Secondary | ICD-10-CM

## 2021-01-20 DIAGNOSIS — Z3009 Encounter for other general counseling and advice on contraception: Secondary | ICD-10-CM | POA: Diagnosis not present

## 2021-01-20 MED ORDER — MEDROXYPROGESTERONE ACETATE 150 MG/ML IM SUSP
150.0000 mg | Freq: Once | INTRAMUSCULAR | Status: AC
  Administered 2021-01-20: 150 mg via INTRAMUSCULAR

## 2021-01-20 NOTE — Progress Notes (Signed)
11 weeks 4 days post depo. Voices no concerns. Depo given per order by C. [place], [place] dated 03/10/2020. Physical due at next depo, approx 04/07/2021. Pt has reminder. Jerel Shepherd, RN

## 2021-04-06 ENCOUNTER — Telehealth: Payer: Self-pay | Admitting: Family Medicine

## 2021-04-06 NOTE — Telephone Encounter (Signed)
Patient would like a call back from a nurse to make sure she can take her depo shot after the 11 wk mark. Does not wish to get pregnant.

## 2021-04-20 ENCOUNTER — Encounter: Payer: Self-pay | Admitting: Advanced Practice Midwife

## 2021-04-20 ENCOUNTER — Ambulatory Visit (LOCAL_COMMUNITY_HEALTH_CENTER): Payer: BC Managed Care – PPO | Admitting: Physician Assistant

## 2021-04-20 ENCOUNTER — Other Ambulatory Visit: Payer: Self-pay

## 2021-04-20 VITALS — BP 132/90 | Ht 60.0 in | Wt 167.4 lb

## 2021-04-20 DIAGNOSIS — Z3042 Encounter for surveillance of injectable contraceptive: Secondary | ICD-10-CM | POA: Diagnosis not present

## 2021-04-20 DIAGNOSIS — Z3009 Encounter for other general counseling and advice on contraception: Secondary | ICD-10-CM

## 2021-04-20 DIAGNOSIS — Z0000 Encounter for general adult medical examination without abnormal findings: Secondary | ICD-10-CM

## 2021-04-23 ENCOUNTER — Encounter: Payer: Self-pay | Admitting: Physician Assistant

## 2021-04-23 MED ORDER — MEDROXYPROGESTERONE ACETATE 150 MG/ML IM SUSP
150.0000 mg | INTRAMUSCULAR | Status: DC
  Administered 2021-04-23: 150 mg via INTRAMUSCULAR

## 2021-04-23 NOTE — Progress Notes (Signed)
Family Planning Visit- Repeat Yearly Visit  Subjective:  Sara Schneider is a 45 y.o. [phone]  being seen today for an annual wellness visit and to discuss contraception options.   The patient is currently using Depo Provera for pregnancy prevention. Patient does not want a pregnancy in the next year. Patient has the following medical problems: has History of abnormal cervical Pap smear; Obesity, unspecified; Sickle-cell trait (HCC); and Benign paroxysmal positional vertigo due to bilateral vestibular disorder on their problem list.  Chief Complaint  Patient presents with   Contraception    Annual exam.    Patient reports that she is doing well and desires to continue with Depo as her BCM.  Patient denies any changes to her personal and family history since her last visit.  Reports that she had a PE with her PCP in February and comes here for her Depo and pap smear follow up.  Per chart review, CBE is due annually and pap is due this year as well.  Patient denies any other concerns.   See flowsheet for other program required questions.   Body mass index is 32.69 kg/m. - Patient is eligible for diabetes screening based on BMI and age >42?  yes HA1C ordered? No, patient declines.  Patient reports 1 of partners in last year. Desires STI screening?  No - patient decllines   Has patient been screened once for HCV in the past?  No  No results found for: HCVAB  Does the patient have current of drug use, have a partner with drug use, and/or has been incarcerated since last result? No  If yes-- Screen for HCV through [facility]   Does the patient meet criteria for HBV testing? No  Criteria:  -Household, sexual or needle sharing contact with HBV -History of drug use -HIV positive -Those with known Hep C   Health Maintenance Due  Topic Date Due   Pneumococcal Vaccine 79-61 Years old (1 - PCV) Never done   HIV Screening  Never done   Hepatitis C Screening  Never done   INFLUENZA  VACCINE  03/09/2021    Review of Systems  All other systems reviewed and are negative.  The following portions of the patient's history were reviewed and updated as appropriate: allergies, current medications, past family history, past medical history, past social history, past surgical history and problem list. Problem list updated.  Objective:   Vitals:   04/20/21 1039  BP: 132/90  Weight: 167 lb 6.4 oz (75.9 kg)  Height: 5' (1.524 m)    Physical Exam Vitals and nursing note reviewed.  Constitutional:      General: She is not in acute distress.    Appearance: Normal appearance.  HENT:     Head: Normocephalic and atraumatic.  Eyes:     Conjunctiva/sclera: Conjunctivae normal.  Pulmonary:     Effort: Pulmonary effort is normal.  Skin:    General: Skin is warm and dry.  Neurological:     Mental Status: She is alert and oriented to person, place, and time.  Psychiatric:        Mood and Affect: Mood normal.        Behavior: Behavior normal.        Thought Content: Thought content normal.        Judgment: Judgment normal.      Assessment and Plan:  Sara Schneider is a 45 y.o. female [phone] presenting to the [facility] Department for an yearly wellness  and contraception visit  Contraception counseling: Reviewed all forms of birth control options in the tiered based approach. available including abstinence; over the counter/barrier methods; hormonal contraceptive medication including pill, patch, ring, injection,contraceptive implant, ECP; hormonal and nonhormonal IUDs; permanent sterilization options including vasectomy and the various tubal sterilization modalities. Risks, benefits, and typical effectiveness rates were reviewed.  Questions were answered.  Written information was also given to the patient to review.  Patient desires to continue with Depo, this was prescribed for patient. She will follow up in  3 months for her physical and pap as well as Depo for  surveillance.  She was told to call with any further questions, or with any concerns about this method of contraception.  Emphasized use of condoms 100% of the time for STI prevention.  Patient was not a candidate for ECP today.   1. Encounter for counseling regarding contraception Reviewed with patient as above re: BCM options. Reviewed with patient normal SE of Depo and when to call clinic with concerns. Enc condoms with all sex for STD protection.   2. Well woman exam (no gynecological exam) Reviewed with patient healthy habits to maintain general health. Enc MVI 1 po daily. Enc to establish with/ follow up with PCP for primary care concerns, age appropriate screenings and illness.   3. Surveillance for Depo-Provera contraception Continue with Depo 150 mg IM q 11-13 weeks for 1 year. - medroxyPROGESTERone (DEPO-PROVERA) injection 150 mg     Return in about 11 weeks (around 07/06/2021) for Physical exam, pap and Depo .  No future appointments.  Matt Holmes, PA

## 2021-05-11 NOTE — Progress Notes (Signed)
PAP Letter mailed today.  PAP due 04-2021. Osbaldo Mark, RN  

## 2021-07-01 ENCOUNTER — Telehealth: Payer: Self-pay

## 2021-07-01 NOTE — Telephone Encounter (Signed)
Telephone call to patient regarding the need for a repeat PAP due 04-2021.  Patient has a Depo appointment 07/10/2021 at 3:55 pm, but our schedule could not accommodate a PAP.  She recently had a PAP on 04-20-2021 with Sadie Haber, PA.  Patient will call back to schedule or she will schedule at her Depo appointment. Hart Carwin, RN

## 2021-07-10 ENCOUNTER — Ambulatory Visit (LOCAL_COMMUNITY_HEALTH_CENTER): Payer: BC Managed Care – PPO

## 2021-07-10 ENCOUNTER — Other Ambulatory Visit: Payer: Self-pay

## 2021-07-10 VITALS — BP 128/87 | Ht 60.0 in | Wt 165.0 lb

## 2021-07-10 DIAGNOSIS — Z3009 Encounter for other general counseling and advice on contraception: Secondary | ICD-10-CM

## 2021-07-10 DIAGNOSIS — Z3042 Encounter for surveillance of injectable contraceptive: Secondary | ICD-10-CM | POA: Diagnosis not present

## 2021-07-10 NOTE — Progress Notes (Signed)
DMPA 150 mg given IM (Right Deltoid) today per 04/20/2021 order by Sadie Haber, PA and tolerated well.  Patient is 11w 4d post last Depo. Hart Carwin, RN

## 2021-09-28 ENCOUNTER — Ambulatory Visit (LOCAL_COMMUNITY_HEALTH_CENTER): Payer: BC Managed Care – PPO | Admitting: Family Medicine

## 2021-09-28 ENCOUNTER — Other Ambulatory Visit: Payer: Self-pay

## 2021-09-28 ENCOUNTER — Ambulatory Visit: Payer: BC Managed Care – PPO

## 2021-09-28 VITALS — BP 127/88 | Ht 60.0 in | Wt 166.0 lb

## 2021-09-28 DIAGNOSIS — Z3009 Encounter for other general counseling and advice on contraception: Secondary | ICD-10-CM

## 2021-09-28 DIAGNOSIS — Z8742 Personal history of other diseases of the female genital tract: Secondary | ICD-10-CM

## 2021-09-28 DIAGNOSIS — Z3042 Encounter for surveillance of injectable contraceptive: Secondary | ICD-10-CM

## 2021-09-28 MED ORDER — MEDROXYPROGESTERONE ACETATE 150 MG/ML IM SUSP
150.0000 mg | INTRAMUSCULAR | Status: AC
  Administered 2021-09-28 – 2022-03-23 (×3): 150 mg via INTRAMUSCULAR

## 2021-09-28 NOTE — Progress Notes (Signed)
S: pt in clinic today for pap and depo   O: PE was done 04/2021,  last pap was 03/10/2020 NIL with + HPV.   A: 1. History of abnormal cervical Pap smear - IGP, Aptima HPV 2. Encounter for surveillance of injectable contraceptive - medroxyPROGESTERone (DEPO-PROVERA) injection 150 mg  P: Pap collected today.  No record of patient having Colpo completed from previous pap.   Discussed with patient about need for colpo if results are abnormal.   Ok for DMPA 150 mg IM x 11-13 weeks x 3 doses.  New order written d/t previous prescribing provider no longer at agency.   Pt will be contact with results of PAP.   PT denies need of STI testing today.   Wendi Snipes, FNP

## 2021-10-02 LAB — IGP, APTIMA HPV
HPV Aptima: NEGATIVE
PAP Smear Comment: 0

## 2021-12-25 ENCOUNTER — Ambulatory Visit (LOCAL_COMMUNITY_HEALTH_CENTER): Payer: BC Managed Care – PPO

## 2021-12-25 VITALS — BP 122/87 | Ht 60.0 in | Wt 173.0 lb

## 2021-12-25 DIAGNOSIS — Z3042 Encounter for surveillance of injectable contraceptive: Secondary | ICD-10-CM

## 2021-12-25 DIAGNOSIS — Z3009 Encounter for other general counseling and advice on contraception: Secondary | ICD-10-CM | POA: Diagnosis not present

## 2021-12-25 NOTE — Progress Notes (Signed)
12 weeks and 4 days post Depo. Voices no concerns.  Depo given today per order by Hilario Quarry 09/28/2021.  Tolerated well in Right Deltoid.  Next Depo due 03/12/2022.  Reana Chacko Shelda Pal, RN

## 2022-03-23 ENCOUNTER — Ambulatory Visit (LOCAL_COMMUNITY_HEALTH_CENTER): Payer: BC Managed Care – PPO

## 2022-03-23 ENCOUNTER — Ambulatory Visit: Payer: BC Managed Care – PPO

## 2022-03-23 VITALS — BP 118/83 | Ht 60.0 in | Wt 170.5 lb

## 2022-03-23 DIAGNOSIS — Z3009 Encounter for other general counseling and advice on contraception: Secondary | ICD-10-CM | POA: Diagnosis not present

## 2022-03-23 DIAGNOSIS — Z3042 Encounter for surveillance of injectable contraceptive: Secondary | ICD-10-CM | POA: Diagnosis not present

## 2022-03-23 NOTE — Progress Notes (Signed)
12 weeks 4 days post depo. Voices no concerns. Depo given today to complete order by Elveria Rising, FNP dated 09/28/21. Tolerated well  L delt. Due for return PE and next depo, approx 06/08/22. Has reminder. Jerel Shepherd, RN

## 2022-05-17 ENCOUNTER — Other Ambulatory Visit: Payer: Self-pay | Admitting: Internal Medicine

## 2022-05-17 DIAGNOSIS — Z1231 Encounter for screening mammogram for malignant neoplasm of breast: Secondary | ICD-10-CM

## 2022-06-15 ENCOUNTER — Ambulatory Visit
Admission: RE | Admit: 2022-06-15 | Discharge: 2022-06-15 | Disposition: A | Discharge status: Home / Self Care | Payer: BC Managed Care – PPO | Source: Ambulatory Visit | Attending: Internal Medicine | Admitting: Internal Medicine

## 2022-06-15 DIAGNOSIS — Z1231 Encounter for screening mammogram for malignant neoplasm of breast: Secondary | ICD-10-CM | POA: Insufficient documentation

## 2022-06-22 ENCOUNTER — Ambulatory Visit (LOCAL_COMMUNITY_HEALTH_CENTER): Payer: BC Managed Care – PPO | Admitting: Nurse Practitioner

## 2022-06-22 VITALS — BP 141/95 | HR 90 | Ht 61.0 in | Wt 173.0 lb

## 2022-06-22 DIAGNOSIS — Z0000 Encounter for general adult medical examination without abnormal findings: Secondary | ICD-10-CM

## 2022-06-22 DIAGNOSIS — Z308 Encounter for other contraceptive management: Secondary | ICD-10-CM | POA: Diagnosis not present

## 2022-06-22 DIAGNOSIS — Z3009 Encounter for other general counseling and advice on contraception: Secondary | ICD-10-CM

## 2022-06-22 DIAGNOSIS — Z30013 Encounter for initial prescription of injectable contraceptive: Secondary | ICD-10-CM

## 2022-06-22 DIAGNOSIS — Z3042 Encounter for surveillance of injectable contraceptive: Secondary | ICD-10-CM

## 2022-06-22 MED ORDER — MEDROXYPROGESTERONE ACETATE 150 MG/ML IM SUSP
150.0000 mg | INTRAMUSCULAR | Status: AC
  Administered 2022-06-22 – 2023-05-19 (×5): 150 mg via INTRAMUSCULAR

## 2022-06-22 NOTE — Progress Notes (Signed)
[facility] DEPARTMENT [facility] [address] Main Number: [phone]    Family Planning Visit- Initial Visit  Subjective:  Sara Schneider is a 46 y.o.  [phone]   being seen today for an initial annual visit and to discuss reproductive life planning.  The patient is currently using Hormonal Injection for pregnancy prevention. Patient reports   does not want a pregnancy in the next year.     report they are looking for a method that provides High efficacy at preventing pregnancy  Patient has the following medical conditions has History of abnormal cervical Pap smear; Obesity, unspecified; Sickle-cell trait (HCC); and Benign paroxysmal positional vertigo due to bilateral vestibular disorder on their problem list.  Chief Complaint  Patient presents with   Contraception    PE and Depo    Patient reports to clinic today for a physical and Depo.    Body mass index is 32.69 kg/m. - Patient is eligible for diabetes screening based on BMI and age >61?  yes HA1C ordered? No, patient refused   Patient reports 1  partner/s in last year. Desires STI screening?  No - refused  Has patient been screened once for HCV in the past?  No   Does the patient have current drug use (including MJ), have a partner with drug use, and/or has been incarcerated since last result? No  If yes-- Screen for HCV through [facility]   Does the patient meet criteria for HBV testing? No  Criteria:  -Household, sexual or needle sharing contact with HBV -History of drug use -HIV positive -Those with known Hep C   Health Maintenance Due  Topic Date Due   HIV Screening  Never done   Hepatitis C Screening  Never done   COVID-19 Vaccine (4 - Pfizer series) 09/01/2020   INFLUENZA VACCINE  03/09/2022   COLONOSCOPY (Pts 45-24yrs Insurance coverage will need to be confirmed)  Never done    Review of Systems  Constitutional:  Negative for chills, fever, malaise/fatigue  and weight loss.  HENT:  Negative for congestion, hearing loss and sore throat.   Eyes:  Positive for blurred vision. Negative for double vision and photophobia.  Respiratory:  Negative for shortness of breath.   Cardiovascular:  Negative for chest pain.  Gastrointestinal:  Negative for abdominal pain, blood in stool, constipation, diarrhea, heartburn, nausea and vomiting.  Genitourinary:  Negative for dysuria and frequency.  Musculoskeletal:  Negative for back pain, joint pain and neck pain.  Skin:  Negative for itching and rash.  Neurological:  Negative for dizziness, weakness and headaches.       History of vertigo  Endo/Heme/Allergies:  Does not bruise/bleed easily.  Psychiatric/Behavioral:  Negative for depression, substance abuse and suicidal ideas.     The following portions of the patient's history were reviewed and updated as appropriate: allergies, current medications, past family history, past medical history, past social history, past surgical history and problem list. Problem list updated.   See flowsheet for other program required questions.  Objective:   Vitals:   06/22/22 1529  BP: (!) 141/95  Pulse: 90  Weight: 173 lb (78.5 kg)  Height: 5\' 1"  (1.549 m)    Physical Exam Constitutional:      Appearance: Normal appearance.  HENT:     Head: Normocephalic. No abrasion, masses or laceration. Hair is normal.     Jaw: No tenderness or swelling.     Right Ear: External ear normal.  Left Ear: External ear normal.     Nose: Nose normal.     Mouth/Throat:     Lips: Pink. No lesions.     Mouth: Mucous membranes are moist. No lacerations or oral lesions.     Dentition: No dental caries.     Tongue: No lesions.     Palate: No mass and lesions.     Pharynx: No pharyngeal swelling, oropharyngeal exudate, posterior oropharyngeal erythema or uvula swelling.     Tonsils: No tonsillar exudate or tonsillar abscesses.  Eyes:     Pupils: Pupils are equal, round, and  reactive to light.  Neck:     Thyroid: No thyroid mass, thyromegaly or thyroid tenderness.  Cardiovascular:     Rate and Rhythm: Normal rate and regular rhythm.  Pulmonary:     Effort: Pulmonary effort is normal.     Breath sounds: Normal breath sounds.  Chest:  Breasts:    Right: Normal. No swelling, mass, nipple discharge, skin change or tenderness.     Left: Normal. No swelling, mass, nipple discharge, skin change or tenderness.  Abdominal:     General: Abdomen is flat. Bowel sounds are normal.     Palpations: Abdomen is soft.     Tenderness: There is no abdominal tenderness. There is no rebound.  Genitourinary:    Comments: Deferred, declined genital exam Musculoskeletal:     Cervical back: Full passive range of motion without pain and normal range of motion.  Lymphadenopathy:     Cervical: No cervical adenopathy.     Right cervical: No superficial, deep or posterior cervical adenopathy.    Left cervical: No superficial, deep or posterior cervical adenopathy.     Upper Body:     Right upper body: No supraclavicular, axillary or epitrochlear adenopathy.     Left upper body: No supraclavicular, axillary or epitrochlear adenopathy.  Skin:    General: Skin is warm and dry.     Findings: No erythema, laceration, lesion or rash.  Neurological:     Mental Status: She is alert and oriented to person, place, and time.  Psychiatric:        Attention and Perception: Attention normal.        Mood and Affect: Mood normal.        Speech: Speech normal.        Behavior: Behavior normal. Behavior is cooperative.       Assessment and Plan:  Sara Schneider is a 46 y.o. female presenting to the [facility] Department for an initial annual wellness/contraceptive visit  Contraception counseling: Reviewed options based on patient desire and reproductive life plan. Patient is interested in Hormonal Injection. This was provided to the patient today.   Risks, benefits, and  typical effectiveness rates were reviewed.  Questions were answered.  Written information was also given to the patient to review.    The patient will follow up in  11 weeks for surveillance and Depo.  The patient was told to call with any further questions, or with any concerns about this method of contraception.  Emphasized use of condoms 100% of the time for STI prevention.  Need for ECP was assessed. ECP not ordered due to continued use of birth control.    1. Family planning counseling -46 year old female in clinic today for a physical and Depo. -ROS reviewed, patient reports history of vertigo and blurry vision.  Patient advised to have continued follow up with PCP and Ophthalmologists for management of signs  and symptoms.   -Patient desires to continue with Depo as birth control method. -STD screening offered, patient declines.   2. Surveillance for Depo-Provera contraception -May have Depo 150 MG IM q 11-13 weeks x 1 year.  - medroxyPROGESTERone (DEPO-PROVERA) injection 150 mg  3. Well woman exam (no gynecological exam) -Normal well woman exam.   -CBE today, next due 06/2023 -PAP due  09/2022   Total time spent: 30 minutes   Return in about 11 weeks (around 09/07/2022) for Routine DMPA injection.    Glenna Fellows, FNP

## 2022-06-22 NOTE — Progress Notes (Signed)
Pt appointment for PE and Depo. Seen by FNP White. Family planning packet given and contents reviewed. Depo administered and appointment reminder card given.

## 2022-06-30 NOTE — Addendum Note (Signed)
Addended by: Glenna Fellows on: 06/30/2022 05:04 PM   Modules accepted: Level of Service

## 2022-09-15 ENCOUNTER — Ambulatory Visit (LOCAL_COMMUNITY_HEALTH_CENTER): Payer: BC Managed Care – PPO

## 2022-09-15 VITALS — BP 127/86 | Wt 168.0 lb

## 2022-09-15 DIAGNOSIS — Z30013 Encounter for initial prescription of injectable contraceptive: Secondary | ICD-10-CM | POA: Diagnosis not present

## 2022-09-15 DIAGNOSIS — Z3009 Encounter for other general counseling and advice on contraception: Secondary | ICD-10-CM

## 2022-09-15 DIAGNOSIS — Z308 Encounter for other contraceptive management: Secondary | ICD-10-CM | POA: Diagnosis not present

## 2022-09-15 DIAGNOSIS — Z3042 Encounter for surveillance of injectable contraceptive: Secondary | ICD-10-CM

## 2022-09-15 NOTE — Progress Notes (Signed)
12 weeks 1 day post depo. Voices no concerns. States BP is lower now that her PCP added an additional BP med. Has regular f-u with [facility]. BP today 127/86.   Depo given today per order by Collene Leyden, FNP dated 06/22/2022. Tolerated well R delt. Next depo due 12/01/2022, has reminder. Josie Saunders, RN

## 2022-10-02 IMAGING — MG MM DIGITAL SCREENING BILAT W/ TOMO AND CAD
8 series · 8 of 24 positions shown · non-contrast
Comparison: Previous exam(s).

CLINICAL DATA: Screening.

EXAM:
DIGITAL SCREENING BILATERAL MAMMOGRAM WITH TOMOSYNTHESIS AND CAD
TECHNIQUE: Bilateral screening digital craniocaudal and mediolateral oblique
mammograms were obtained. Bilateral screening digital breast
tomosynthesis was performed. The images were evaluated with
computer-aided detection.

[L MLO synth-2D]
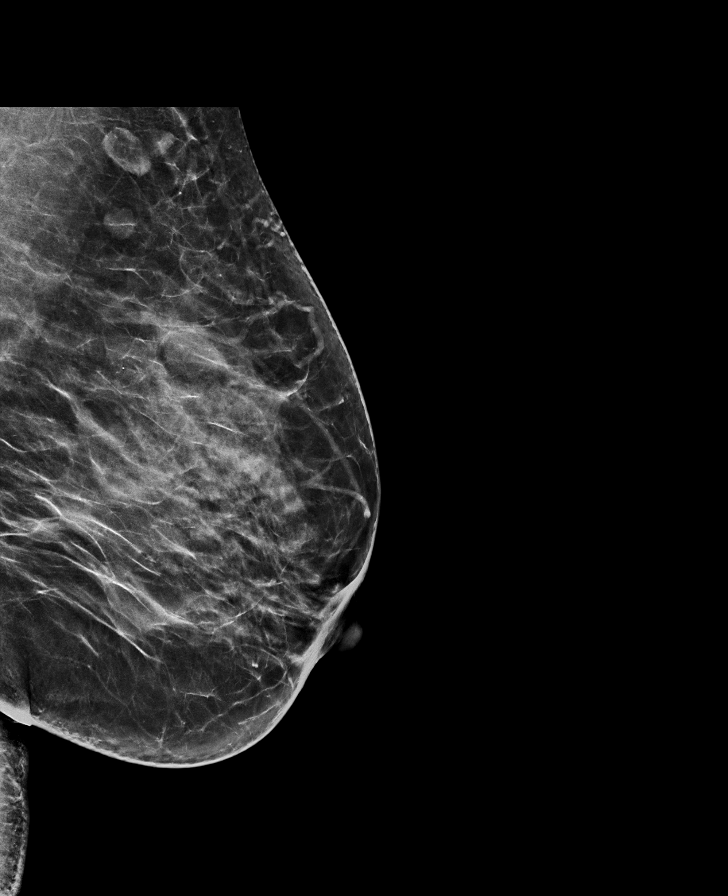

[R CC synth-2D]
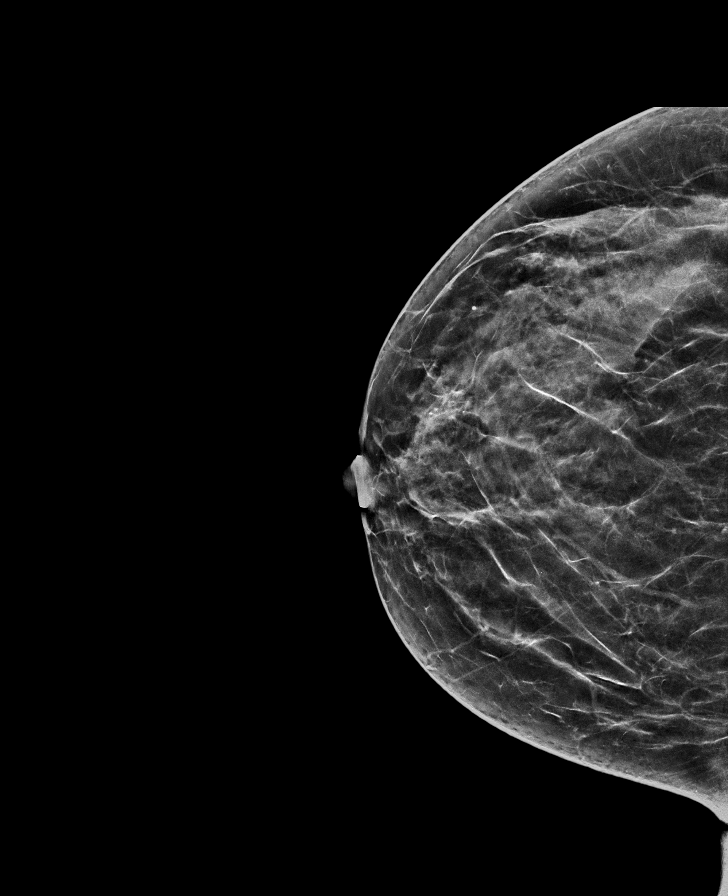

[L CC synth-2D]
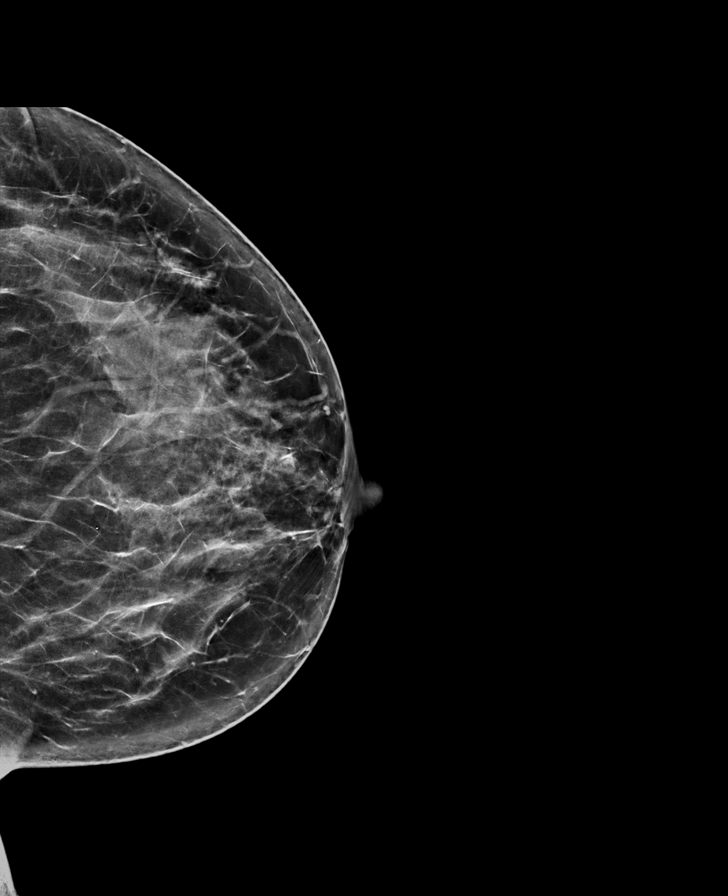

[R MLO synth-2D]
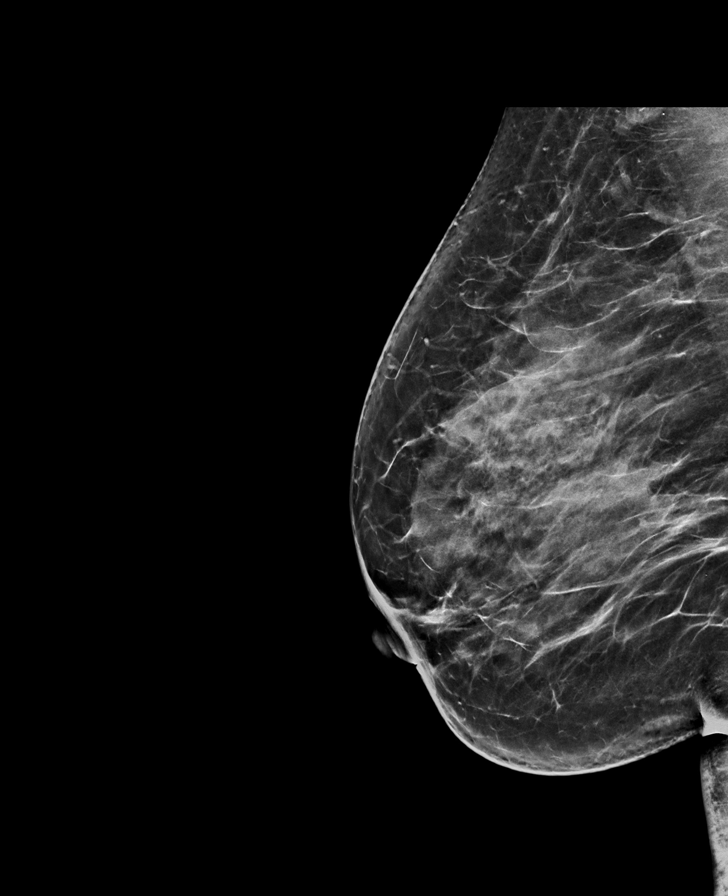

[R CC tomo · tomo slice 31/62.0]
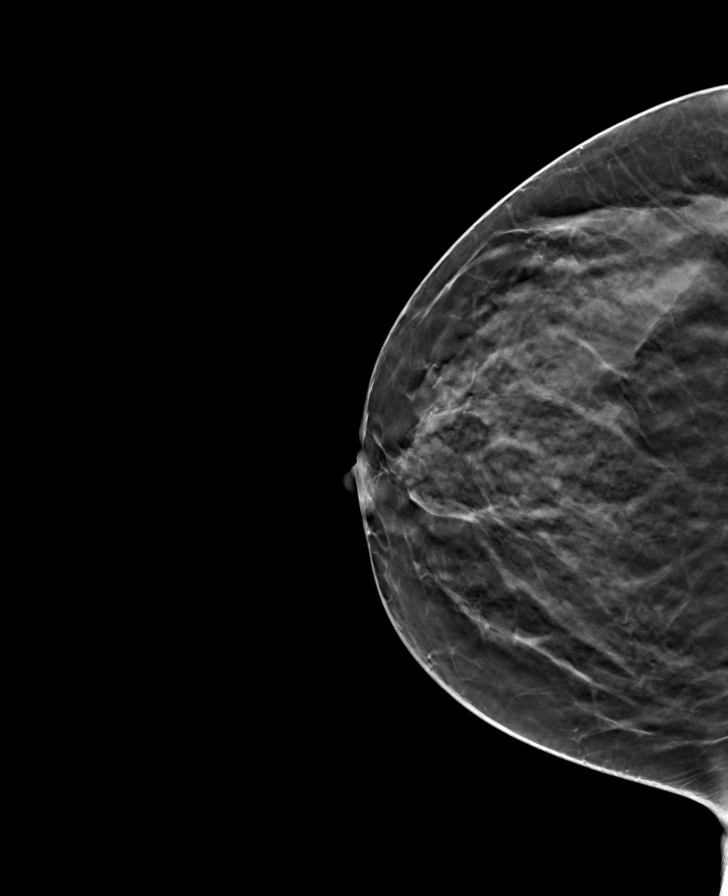

[R MLO tomo · tomo slice 35/70.0]
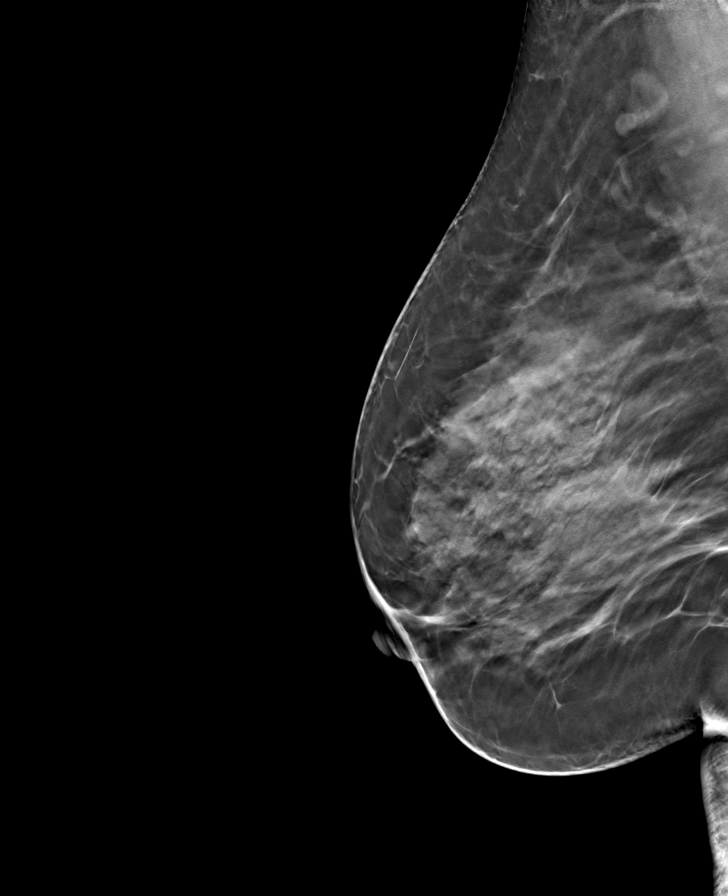

[L MLO tomo · tomo slice 36/71.0]
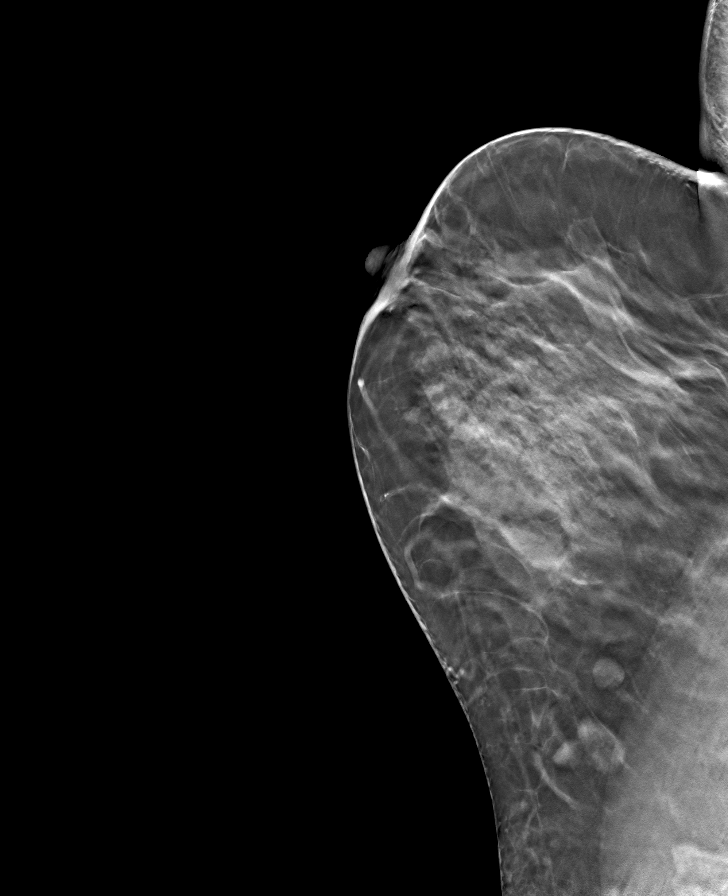

[L CC tomo · tomo slice 36/71.0]
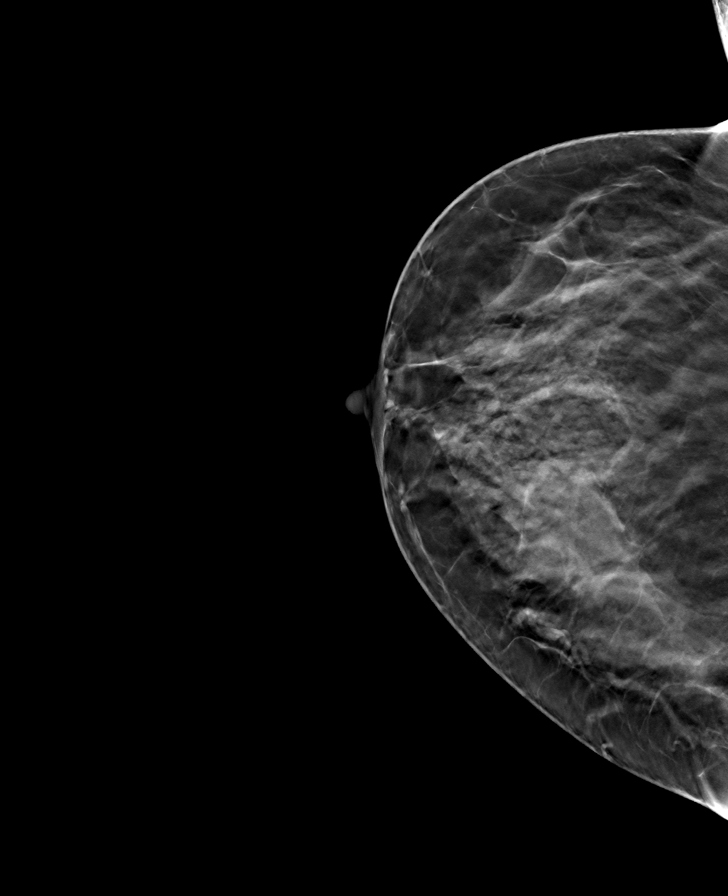

[8 of 24 positions shown; findings below may reference images not displayed]

ACR Breast Density Category c: The breast tissue is heterogeneously
dense, which may obscure small masses.
FINDINGS: There are no findings suspicious for malignancy. The images were
evaluated with computer-aided detection.
IMPRESSION: No mammographic evidence of malignancy. A result letter of this
screening mammogram will be mailed directly to the patient.

RECOMMENDATION:
Screening mammogram in one year. (Code:T4-5-GWO)

BI-RADS CATEGORY  1: Negative.

## 2022-12-01 ENCOUNTER — Ambulatory Visit (LOCAL_COMMUNITY_HEALTH_CENTER): Payer: BC Managed Care – PPO

## 2022-12-01 VITALS — BP 116/79 | Wt 167.0 lb

## 2022-12-01 DIAGNOSIS — Z308 Encounter for other contraceptive management: Secondary | ICD-10-CM | POA: Diagnosis not present

## 2022-12-01 DIAGNOSIS — Z3042 Encounter for surveillance of injectable contraceptive: Secondary | ICD-10-CM | POA: Diagnosis not present

## 2022-12-01 DIAGNOSIS — Z3009 Encounter for other general counseling and advice on contraception: Secondary | ICD-10-CM

## 2022-12-01 NOTE — Progress Notes (Signed)
11wks post last depo.Voices no concerns. Depo given per order by A.White,FNP dated 06/22/2022. Tolerated well to R ventrogluteal. Next depo due 02/16/23.

## 2023-02-22 ENCOUNTER — Ambulatory Visit (LOCAL_COMMUNITY_HEALTH_CENTER): Payer: BC Managed Care – PPO

## 2023-02-22 VITALS — BP 135/96 | Ht 61.0 in | Wt 175.0 lb

## 2023-02-22 DIAGNOSIS — Z3042 Encounter for surveillance of injectable contraceptive: Secondary | ICD-10-CM | POA: Diagnosis not present

## 2023-02-22 DIAGNOSIS — Z308 Encounter for other contraceptive management: Secondary | ICD-10-CM

## 2023-02-22 DIAGNOSIS — Z3009 Encounter for other general counseling and advice on contraception: Secondary | ICD-10-CM

## 2023-02-22 NOTE — Progress Notes (Signed)
11 weeks 6 days post depo. BP elevated at 131/91 and 5 min later 135/96. Takes HBP meds daily and has regular f-u with [facility]. Patient admits to drinking caffeine earlier today. Denies headache, chest pain, SOB, vision changes. Advised to seek immediate med attn/go to ER if chest pain, SOB.  RN wrote down BP readings so that she can inform her provider.   Consult Aliene Altes, FNP who gives ok for depo today based on original order by Onalee Hua, FNP dated 06/22/2022. Tolerated well R Delt.  Next depo due 05/10/2023 and annual PE due 06/24/2023, pt aware of both. Jerel Shepherd, RN

## 2023-05-19 ENCOUNTER — Ambulatory Visit: Payer: BC Managed Care – PPO

## 2023-05-19 VITALS — BP 114/78 | Ht 61.0 in | Wt 173.5 lb

## 2023-05-19 DIAGNOSIS — Z3042 Encounter for surveillance of injectable contraceptive: Secondary | ICD-10-CM | POA: Diagnosis not present

## 2023-05-19 DIAGNOSIS — Z308 Encounter for other contraceptive management: Secondary | ICD-10-CM

## 2023-05-19 DIAGNOSIS — Z3009 Encounter for other general counseling and advice on contraception: Secondary | ICD-10-CM

## 2023-05-19 NOTE — Progress Notes (Signed)
12 weeks 2 days post depo. Voices no concerns. Continues to take BP meds.   Depo given today per order by Onalee Hua, FNP dated 06/22/2022. Tolerated well L delt.  PE due 06/24/2023 and patient plans to have PE when next depo is due approx 08/04/2023. Jerel Shepherd, RN

## 2023-08-08 ENCOUNTER — Encounter: Payer: Self-pay | Admitting: Family Medicine

## 2023-08-08 ENCOUNTER — Ambulatory Visit: Payer: BC Managed Care – PPO | Admitting: Family Medicine

## 2023-08-08 VITALS — BP 134/91 | HR 106 | Wt 166.6 lb

## 2023-08-08 DIAGNOSIS — Z01419 Encounter for gynecological examination (general) (routine) without abnormal findings: Secondary | ICD-10-CM

## 2023-08-08 DIAGNOSIS — Z8742 Personal history of other diseases of the female genital tract: Secondary | ICD-10-CM

## 2023-08-08 DIAGNOSIS — Z30013 Encounter for initial prescription of injectable contraceptive: Secondary | ICD-10-CM | POA: Diagnosis not present

## 2023-08-08 DIAGNOSIS — Z308 Encounter for other contraceptive management: Secondary | ICD-10-CM | POA: Diagnosis not present

## 2023-08-08 DIAGNOSIS — Z3009 Encounter for other general counseling and advice on contraception: Secondary | ICD-10-CM

## 2023-08-08 MED ORDER — MEDROXYPROGESTERONE ACETATE 150 MG/ML IM SUSP
150.0000 mg | INTRAMUSCULAR | Status: AC
  Administered 2023-08-08 – 2024-07-18 (×5): 150 mg via INTRAMUSCULAR

## 2023-08-08 NOTE — Progress Notes (Signed)
[facility] DEPARTMENT [facility] [address] Main Number: [phone]  Family Planning Visit- Repeat Yearly Visit  Subjective:  Sara Schneider is a 47 y.o. [phone]  being seen today for an annual wellness visit and to discuss contraception options.   The patient is currently using Hormonal Injection for pregnancy prevention. Patient does not want a pregnancy in the next year.    report they are looking for a method that provides High efficacy at preventing pregnancy   Patient has the following medical problems: has History of abnormal cervical Pap smear; Obesity, unspecified; Sickle-cell trait (HCC); and Benign paroxysmal positional vertigo due to bilateral vestibular disorder on their problem list.  Chief Complaint  Patient presents with   Annual Exam    PE/ Depo    Patient reports to clinic for PE and depo. Had physical this year with [facility] in April 2024. Hx of abnormal pap smear- repeat today. Reports last mammogram was 2023, and she called today to make an appointment for her mammogram in 2025.  Patient denies concerns about self   See flowsheet for other program required questions.   Body mass index is 31.48 kg/m. - Patient is eligible for diabetes screening based on BMI> 25 and age >35?  yes HA1C ordered? No- seen by PCP and has blood work done there  Patient reports 1 of partners in last year. Desires STI screening?  No - declined   Has patient been screened once for HCV in the past?  No  No results found for: "HCVAB"  Does the patient have current of drug use, have a partner with drug use, and/or has been incarcerated since last result? No  If yes-- Screen for HCV through [facility]   Does the patient meet criteria for HBV testing? No  Criteria:  -Household, sexual or needle sharing contact with HBV -History of drug use -HIV positive -Those with known Hep C   Health Maintenance Due  Topic Date Due   HIV  Screening  Never done   Hepatitis C Screening  Never done   Colonoscopy  Never done   Cervical Cancer Screening (Pap smear)  09/28/2022   COVID-19 Vaccine (4 - 2024-25 season) 04/10/2023    Review of Systems  Constitutional:  Negative for weight loss.  Eyes:  Negative for blurred vision.  Respiratory:  Negative for cough and shortness of breath.   Cardiovascular:  Negative for claudication.  Gastrointestinal:  Negative for nausea.  Genitourinary:  Negative for dysuria and frequency.  Skin:  Negative for rash.  Neurological:  Negative for headaches.  Endo/Heme/Allergies:  Does not bruise/bleed easily.    The following portions of the patient's history were reviewed and updated as appropriate: allergies, current medications, past family history, past medical history, past social history, past surgical history and problem list. Problem list updated.  Objective:   Vitals:   08/08/23 1016  BP: (!) 134/91  Pulse: (!) 106  Weight: 166 lb 9.6 oz (75.6 kg)    Physical Exam Vitals and nursing note reviewed. Exam conducted with a chaperone present Marylu Lund Idol FNP).  Constitutional:      Appearance: Normal appearance.  HENT:     Head: Normocephalic and atraumatic.     Mouth/Throat:     Mouth: Mucous membranes are moist.     Pharynx: Oropharynx is clear. No oropharyngeal exudate or posterior oropharyngeal erythema.  Pulmonary:     Effort: Pulmonary effort is normal.  Abdominal:  General: Abdomen is flat.     Palpations: There is no mass.     Tenderness: There is no abdominal tenderness. There is no rebound.  Genitourinary:    General: Normal vulva.     Exam position: Lithotomy position.     Pubic Area: No rash or pubic lice.      Labia:        Right: No rash or lesion.        Left: No rash or lesion.      Vagina: Normal. No vaginal discharge, erythema, bleeding or lesions.     Cervix: No cervical motion tenderness, discharge, friability, lesion or erythema.     Uterus:  Normal.      Adnexa: Right adnexa normal and left adnexa normal.     Rectum: Normal.  Lymphadenopathy:     Head:     Right side of head: No preauricular or posterior auricular adenopathy.     Left side of head: No preauricular or posterior auricular adenopathy.     Cervical: No cervical adenopathy.     Upper Body:     Right upper body: No supraclavicular, axillary or epitrochlear adenopathy.     Left upper body: No supraclavicular, axillary or epitrochlear adenopathy.     Lower Body: No right inguinal adenopathy. No left inguinal adenopathy.  Skin:    General: Skin is warm and dry.     Findings: No rash.  Neurological:     Mental Status: She is alert and oriented to person, place, and time.       Assessment and Plan:  Sara Schneider is a 47 y.o. female [phone] presenting to the [facility] Department for an yearly wellness and contraception visit  1. Family planning (Primary) Contraception counseling: Reviewed options based on patient desire and reproductive life plan. Patient is interested in Hormonal Injection. This was provided to the patient today.   Risks, benefits, and typical effectiveness rates were reviewed.  Questions were answered.  Written information was also given to the patient to review.    The patient will follow up in  3 months for surveillance.  The patient was told to call with any further questions, or with any concerns about this method of contraception.  Emphasized use of condoms 100% of the time for STI prevention.  Educated on ECP and assessed need for ECP. Not indicated- covered under depo window  - medroxyPROGESTERone (DEPO-PROVERA) injection 150 mg  2. Well woman exam with routine gynecological exam -declined CBE today -last mammogram in 2023, made appointment for 2025 -no concerns about self -has PCP with [facility]  3. History of abnormal cervical Pap smear -last pap normal, hx of HPV positive pap smear in  03/2020 -recommended colpo- however no records of this   - IGP, Aptima HPV    Return in about 3 months (around 11/06/2023) for depo injection.  No future appointments.  Lenice Llamas, [place]

## 2023-08-08 NOTE — Progress Notes (Signed)
Pt is here for an annual visit, pap smear and Depo. Depo 150 mg given IM in Rt deltoid. Pt given FP packet and a reminder card to return in 11-13 weeks for next Depo.  Berdie Ogren, RN

## 2023-08-13 LAB — IGP, APTIMA HPV
HPV Aptima: NEGATIVE
PAP Smear Comment: 0

## 2023-08-15 ENCOUNTER — Telehealth: Payer: Self-pay | Admitting: Family Medicine

## 2023-08-15 NOTE — Telephone Encounter (Signed)
 Patient wants to talk to nurse about her results about previous visit.

## 2023-08-16 ENCOUNTER — Ambulatory Visit: Payer: BC Managed Care – PPO

## 2023-08-16 DIAGNOSIS — A599 Trichomoniasis, unspecified: Secondary | ICD-10-CM

## 2023-08-16 MED ORDER — METRONIDAZOLE 500 MG PO TABS: 500.0000 mg | ORAL_TABLET | Freq: Two times a day (BID) | ORAL | Status: AC

## 2023-08-16 NOTE — Progress Notes (Signed)
 In nurse clinic for Trich treatment. Treated today per order by HILARIO Bers, FNP dated 08/16/2023.   The patient was dispensed Metronidazole  500mg  #14 today. I provided counseling today regarding the medication. We discussed the medication, the side effects and when to call clinic. Patient given the opportunity to ask questions. Questions answered.   Sara CINDERELLA Shuck, RN

## 2023-09-08 ENCOUNTER — Other Ambulatory Visit: Payer: Self-pay | Admitting: Internal Medicine

## 2023-09-08 DIAGNOSIS — Z1231 Encounter for screening mammogram for malignant neoplasm of breast: Secondary | ICD-10-CM

## 2023-09-15 ENCOUNTER — Ambulatory Visit
Admission: RE | Admit: 2023-09-15 | Discharge: 2023-09-15 | Disposition: A | Discharge status: Home / Self Care | Payer: BC Managed Care – PPO | Source: Ambulatory Visit | Attending: Internal Medicine | Admitting: Internal Medicine

## 2023-09-15 DIAGNOSIS — Z1231 Encounter for screening mammogram for malignant neoplasm of breast: Secondary | ICD-10-CM | POA: Diagnosis present

## 2023-10-31 ENCOUNTER — Ambulatory Visit

## 2023-10-31 ENCOUNTER — Ambulatory Visit (LOCAL_COMMUNITY_HEALTH_CENTER)

## 2023-10-31 VITALS — BP 122/77 | Ht 61.0 in | Wt 158.5 lb

## 2023-10-31 DIAGNOSIS — Z30013 Encounter for initial prescription of injectable contraceptive: Secondary | ICD-10-CM

## 2023-10-31 DIAGNOSIS — Z308 Encounter for other contraceptive management: Secondary | ICD-10-CM

## 2023-10-31 DIAGNOSIS — Z3009 Encounter for other general counseling and advice on contraception: Secondary | ICD-10-CM

## 2023-10-31 DIAGNOSIS — Z3042 Encounter for surveillance of injectable contraceptive: Secondary | ICD-10-CM

## 2023-10-31 NOTE — Progress Notes (Signed)
 12 Weeks   0 Days since last Depo Patient has been trying to lose weight and has lost 8 pounds since 08/08/2023. Voices no concerns today.  Counseled to adhere to 11 to 13 week intervals between depo injections for optimal benefit.  Depo given today per order by Lenice Llamas, FNP   dated 07/29/2023 for 1 year.  Tolerated well Given Left Deltoid.  Next depo due 01/16/2024 has reminder card.

## 2023-10-31 NOTE — Patient Instructions (Signed)
depo 

## 2024-01-31 ENCOUNTER — Ambulatory Visit

## 2024-01-31 VITALS — BP 122/76 | Ht 61.0 in | Wt 157.0 lb

## 2024-01-31 DIAGNOSIS — Z30013 Encounter for initial prescription of injectable contraceptive: Secondary | ICD-10-CM

## 2024-01-31 DIAGNOSIS — Z308 Encounter for other contraceptive management: Secondary | ICD-10-CM | POA: Diagnosis not present

## 2024-01-31 DIAGNOSIS — Z3009 Encounter for other general counseling and advice on contraception: Secondary | ICD-10-CM

## 2024-01-31 DIAGNOSIS — Z3042 Encounter for surveillance of injectable contraceptive: Secondary | ICD-10-CM

## 2024-01-31 NOTE — Progress Notes (Signed)
 13 Weeks   1 Days since last Depo  Voices no concerns today.  Counseled to adhere to 11 to 13 week intervals between depo injections for optimal benefit.  Depo given today per order by VEAR Bers, FNP  dated 07/29/2023.  Tolerated well RUOQ.  Next depo due 04/17/2024,  has reminder card.  Jahkeem Kurka, RN

## 2024-02-19 ENCOUNTER — Other Ambulatory Visit: Payer: Self-pay

## 2024-02-19 ENCOUNTER — Emergency Department
Admission: EM | Admit: 2024-02-19 | Discharge: 2024-02-19 | Disposition: A | Discharge status: Home / Self Care | Attending: Emergency Medicine | Admitting: Emergency Medicine

## 2024-02-19 DIAGNOSIS — I10 Essential (primary) hypertension: Secondary | ICD-10-CM | POA: Diagnosis not present

## 2024-02-19 DIAGNOSIS — K047 Periapical abscess without sinus: Secondary | ICD-10-CM | POA: Insufficient documentation

## 2024-02-19 DIAGNOSIS — K0889 Other specified disorders of teeth and supporting structures: Secondary | ICD-10-CM | POA: Diagnosis present

## 2024-02-19 MED ORDER — OXYCODONE-ACETAMINOPHEN 5-325 MG PO TABS: 1.0000 | ORAL_TABLET | ORAL | 0 refills | Status: AC | PRN

## 2024-02-19 MED ORDER — OXYCODONE-ACETAMINOPHEN 5-325 MG PO TABS
1.0000 | ORAL_TABLET | Freq: Once | ORAL | Status: AC
  Administered 2024-02-19: 1 via ORAL
  Filled 2024-02-19: qty 1

## 2024-02-19 MED ORDER — CLINDAMYCIN HCL 150 MG PO CAPS
300.0000 mg | ORAL_CAPSULE | Freq: Once | ORAL | Status: AC
  Administered 2024-02-19: 300 mg via ORAL
  Filled 2024-02-19: qty 2

## 2024-02-19 MED ORDER — CLINDAMYCIN HCL 300 MG PO CAPS: 300.0000 mg | ORAL_CAPSULE | Freq: Three times a day (TID) | ORAL | 0 refills | Status: AC

## 2024-02-19 NOTE — ED Provider Notes (Signed)
 [facility] Provider Note    Event Date/Time   First MD Initiated Contact with Patient 02/19/24 0209     (approximate)   History   Chief Complaint Dental Pain   HPI  Sara Schneider is a 48 y.o. female with past medical history of hypertension and sickle cell trait who presents to the ED complaining of dental pain.  Patient reports that she has had 1 week of increasing pain in her left lower molar with some swelling over the past couple of days.  She saw her dentist for this problem and was diagnosed with impacted and infected wisdom tooth, was prescribed amoxicillin, has not had any relief since then.  She is scheduled to see an oral surgeon in 4 days, denies any fevers or difficulty swallowing.     Physical Exam   Triage Vital Signs: ED Triage Vitals  Encounter Vitals Group     BP 02/19/24 0123 (!) 145/98     Girls Systolic BP Percentile --      Girls Diastolic BP Percentile --      Boys Systolic BP Percentile --      Boys Diastolic BP Percentile --      Pulse Rate 02/19/24 0123 90     Resp 02/19/24 0123 16     Temp 02/19/24 0123 98.2 F (36.8 C)     Temp Source 02/19/24 0123 Oral     SpO2 02/19/24 0123 100 %     Weight --      Height 02/19/24 0122 5' 1 (1.549 m)     Head Circumference --      Peak Flow --      Pain Score 02/19/24 0122 3     Pain Loc --      Pain Education --      Exclude from Growth Chart --     Most recent vital signs: Vitals:   02/19/24 0325 02/19/24 0330  BP: 127/77 127/77  Pulse:  92  Resp:  18  Temp:  98.2 F (36.8 C)  SpO2:  100%    Constitutional: Alert and oriented. Eyes: Conjunctivae are normal. Head: Atraumatic. Nose: No congestion/rhinnorhea. Mouth/Throat: Mucous membranes are moist.  Erythema and edema noted over left lower molars without focal fluctuance. Cardiovascular: Normal rate, regular rhythm. Grossly normal heart sounds.  2+ radial pulses bilaterally. Respiratory: Normal respiratory  effort.  No retractions. Lungs CTAB. Gastrointestinal: Soft and nontender. No distention. Musculoskeletal: No lower extremity tenderness nor edema.  Neurologic:  Normal speech and language. No gross focal neurologic deficits are appreciated.    ED Results / Procedures / Treatments   Labs (all labs ordered are listed, but only abnormal results are displayed) Labs Reviewed - No data to display  PROCEDURES:  Critical Care performed: No  Procedures   MEDICATIONS ORDERED IN ED: Medications  oxyCODONE -acetaminophen  (PERCOCET/ROXICET) 5-325 MG per tablet 1 tablet (1 tablet Oral Given 02/19/24 0327)  clindamycin  (CLEOCIN ) capsule 300 mg (300 mg Oral Given 02/19/24 0326)     IMPRESSION / MDM / ASSESSMENT AND PLAN / ED COURSE  I reviewed the triage vital signs and the nursing notes.                              48 y.o. female with past medical history of hypertension and sickle cell trait who presents to the ED complaining of pain and swelling around her left lower molar for the past week.  Patient's presentation is most consistent with acute presentation with potential threat to life or bodily function.  Differential diagnosis includes, but is not limited to, impacted wisdom tooth, dental abscess, dental infection.  Patient nontoxic-appearing and in no acute distress, vital signs are unremarkable.  She has signs of infection around her left lower molar but no apparent drainable abscess.  She has not had any relief with amoxicillin, will change antibiotic to clindamycin  and prescribe short course of pain medication.  She was counseled to follow-up with oral surgeon as scheduled for removal of wisdom tooth, otherwise return to the ED for new or worsening symptoms.  Patient agrees with plan.      FINAL CLINICAL IMPRESSION(S) / ED DIAGNOSES   Final diagnoses:  Dental infection     Rx / DC Orders   ED Discharge Orders          Ordered    oxyCODONE -acetaminophen  (PERCOCET) 5-325  MG tablet  Every 4 hours PRN        02/19/24 0317    clindamycin  (CLEOCIN ) 300 MG capsule  3 times daily        02/19/24 9682             Note:  This document was prepared using Dragon voice recognition software and may include unintentional dictation errors.   Willo Dunnings, MD 02/19/24 [phone]

## 2024-02-19 NOTE — ED Triage Notes (Addendum)
 Pt to ed from home via POV for dental pain. Pt seen the dentist on Thursday and was prescribed amox for a tooth infection and was informed she needed an extraction. Pt is not feeling better. Pt is taking PO pain meds at home with no relief. Pt is caox4, in no acute distress and ambulatory in triage. Pt just took meds at 12pm for pain.

## 2024-04-25 ENCOUNTER — Ambulatory Visit

## 2024-04-25 VITALS — BP 125/81 | Ht 61.0 in | Wt 156.5 lb

## 2024-04-25 DIAGNOSIS — Z3042 Encounter for surveillance of injectable contraceptive: Secondary | ICD-10-CM

## 2024-04-25 DIAGNOSIS — Z3009 Encounter for other general counseling and advice on contraception: Secondary | ICD-10-CM

## 2024-04-25 DIAGNOSIS — Z308 Encounter for other contraceptive management: Secondary | ICD-10-CM | POA: Diagnosis not present

## 2024-04-25 DIAGNOSIS — Z30013 Encounter for initial prescription of injectable contraceptive: Secondary | ICD-10-CM | POA: Diagnosis not present

## 2024-04-25 NOTE — Progress Notes (Signed)
 12 weeks and 1 day post depo. Voices no concerns. Depo given today per order by VEAR Bers, FNP dated 07/29/2023. Tolerated well in LUOQ. Next depo due 07/11/2024; patient aware.  Doyce CINDERELLA Shuck, RN

## 2024-07-18 ENCOUNTER — Ambulatory Visit

## 2024-07-18 VITALS — BP 121/69 | Ht 61.0 in | Wt 156.5 lb

## 2024-07-18 DIAGNOSIS — Z3009 Encounter for other general counseling and advice on contraception: Secondary | ICD-10-CM

## 2024-07-18 DIAGNOSIS — Z308 Encounter for other contraceptive management: Secondary | ICD-10-CM | POA: Diagnosis not present

## 2024-07-18 DIAGNOSIS — Z30013 Encounter for initial prescription of injectable contraceptive: Secondary | ICD-10-CM | POA: Diagnosis not present

## 2024-07-18 DIAGNOSIS — Z3042 Encounter for surveillance of injectable contraceptive: Secondary | ICD-10-CM

## 2024-07-18 NOTE — Progress Notes (Signed)
 12 Weeks   0 Days since last Depo   Voices no concerns today.  Counseled to adhere to 11 to 13 week intervals between depo injections for optimal benefit.  Depo given today per order by VEAR Bers, FNP  dated 08/08/2023.  Tolerated well RUOQ.  Last annual PE 08/08/2023.  Plans to have annual PE when next depo is due, approx 10/04/2023, has reminder card. Sara Schnitker, RN
